# Patient Record
Sex: Male | Born: 1960
Health system: Southern US, Community
[De-identification: ages and names within clinical notes are randomized; demographics above are authoritative.]

## PROBLEM LIST (undated history)

## (undated) DIAGNOSIS — R011 Cardiac murmur, unspecified: Secondary | ICD-10-CM

## (undated) DIAGNOSIS — K589 Irritable bowel syndrome without diarrhea: Secondary | ICD-10-CM

## (undated) DIAGNOSIS — E785 Hyperlipidemia, unspecified: Secondary | ICD-10-CM

## (undated) DIAGNOSIS — R001 Bradycardia, unspecified: Secondary | ICD-10-CM

## (undated) DIAGNOSIS — I10 Essential (primary) hypertension: Secondary | ICD-10-CM

## (undated) DIAGNOSIS — M1710 Unilateral primary osteoarthritis, unspecified knee: Secondary | ICD-10-CM

## (undated) HISTORY — DX: Unilateral primary osteoarthritis, unspecified knee: M17.10

## (undated) HISTORY — DX: Hyperlipidemia, unspecified: E78.5

## (undated) HISTORY — DX: Irritable bowel syndrome, unspecified: K58.9

## (undated) HISTORY — DX: Cardiac murmur, unspecified: R01.1

## (undated) HISTORY — DX: Essential (primary) hypertension: I10

## (undated) HISTORY — DX: Bradycardia, unspecified: R00.1

## (undated) HISTORY — PX: MEDIAL COLLATERAL LIGAMENT REPAIR, KNEE: SHX2019

## (undated) HISTORY — PX: APPENDECTOMY: SHX54

---

## 1999-09-21 ENCOUNTER — Encounter: Payer: Self-pay | Admitting: Orthopedic Surgery

## 1999-09-21 ENCOUNTER — Ambulatory Visit (HOSPITAL_COMMUNITY): Admission: RE | Admit: 1999-09-21 | Discharge: 1999-09-21 | Payer: Self-pay | Admitting: Orthopedic Surgery

## 2005-02-08 ENCOUNTER — Ambulatory Visit: Payer: Self-pay | Admitting: Family Medicine

## 2005-03-17 ENCOUNTER — Ambulatory Visit: Payer: Self-pay | Admitting: Family Medicine

## 2005-03-23 ENCOUNTER — Ambulatory Visit: Payer: Self-pay | Admitting: Family Medicine

## 2005-03-31 ENCOUNTER — Ambulatory Visit: Payer: Self-pay

## 2005-03-31 ENCOUNTER — Encounter: Payer: Self-pay | Admitting: Cardiology

## 2005-09-27 ENCOUNTER — Ambulatory Visit: Payer: Self-pay | Admitting: Family Medicine

## 2006-02-21 ENCOUNTER — Ambulatory Visit: Payer: Self-pay | Admitting: Family Medicine

## 2006-02-23 ENCOUNTER — Ambulatory Visit: Payer: Self-pay | Admitting: Family Medicine

## 2006-04-14 ENCOUNTER — Ambulatory Visit: Payer: Self-pay | Admitting: Family Medicine

## 2006-05-26 ENCOUNTER — Ambulatory Visit: Payer: Self-pay | Admitting: Family Medicine

## 2006-07-05 ENCOUNTER — Ambulatory Visit: Payer: Self-pay | Admitting: Family Medicine

## 2006-07-05 DIAGNOSIS — K219 Gastro-esophageal reflux disease without esophagitis: Secondary | ICD-10-CM | POA: Insufficient documentation

## 2006-08-18 DIAGNOSIS — I10 Essential (primary) hypertension: Secondary | ICD-10-CM | POA: Insufficient documentation

## 2006-08-25 ENCOUNTER — Ambulatory Visit: Payer: Self-pay | Admitting: Family Medicine

## 2006-10-24 ENCOUNTER — Ambulatory Visit: Payer: Self-pay | Admitting: Family Medicine

## 2006-10-24 LAB — CONVERTED CEMR LAB
BUN: 14 mg/dL (ref 6–23)
CO2: 32 meq/L (ref 19–32)
Calcium: 9.1 mg/dL (ref 8.4–10.5)
Chloride: 109 meq/L (ref 96–112)
Creatinine, Ser: 1 mg/dL (ref 0.4–1.5)
GFR calc Af Amer: 103 mL/min
GFR calc non Af Amer: 85 mL/min
Glucose, Bld: 97 mg/dL (ref 70–99)
Potassium: 4.5 meq/L (ref 3.5–5.1)
Sodium: 147 meq/L — ABNORMAL HIGH (ref 135–145)

## 2006-10-25 ENCOUNTER — Encounter (INDEPENDENT_AMBULATORY_CARE_PROVIDER_SITE_OTHER): Payer: Self-pay | Admitting: *Deleted

## 2006-10-27 ENCOUNTER — Ambulatory Visit: Payer: Self-pay | Admitting: Family Medicine

## 2007-02-26 ENCOUNTER — Ambulatory Visit: Payer: Self-pay | Admitting: Family Medicine

## 2007-02-26 LAB — CONVERTED CEMR LAB
ALT: 19 units/L (ref 0–53)
AST: 18 units/L (ref 0–37)
Albumin: 3.8 g/dL (ref 3.5–5.2)
Alkaline Phosphatase: 63 units/L (ref 39–117)
BUN: 15 mg/dL (ref 6–23)
Bilirubin, Direct: 0.1 mg/dL (ref 0.0–0.3)
CO2: 31 meq/L (ref 19–32)
Calcium: 9.4 mg/dL (ref 8.4–10.5)
Chloride: 103 meq/L (ref 96–112)
Cholesterol: 202 mg/dL (ref 0–200)
Creatinine, Ser: 1 mg/dL (ref 0.4–1.5)
Direct LDL: 133.3 mg/dL
GFR calc Af Amer: 103 mL/min
GFR calc non Af Amer: 85 mL/min
Glucose, Bld: 98 mg/dL (ref 70–99)
HDL: 52.8 mg/dL (ref >39.0)
Potassium: 4.4 meq/L (ref 3.5–5.1)
Sodium: 140 meq/L (ref 135–145)
TSH: 0.65 microintl units/mL (ref 0.35–5.50)
Total Bilirubin: 0.7 mg/dL (ref 0.3–1.2)
Total CHOL/HDL Ratio: 3.8
Total Protein: 6.7 g/dL (ref 6.0–8.3)
Triglycerides: 133 mg/dL (ref 0–149)
VLDL: 27 mg/dL (ref 0–40)

## 2007-03-01 ENCOUNTER — Ambulatory Visit: Payer: Self-pay | Admitting: Family Medicine

## 2007-03-01 DIAGNOSIS — K589 Irritable bowel syndrome without diarrhea: Secondary | ICD-10-CM | POA: Insufficient documentation

## 2008-03-03 ENCOUNTER — Ambulatory Visit: Payer: Self-pay | Admitting: Family Medicine

## 2008-03-03 LAB — CONVERTED CEMR LAB
ALT: 19 units/L (ref 0–53)
AST: 15 units/L (ref 0–37)
Albumin: 3.7 g/dL (ref 3.5–5.2)
Alkaline Phosphatase: 54 units/L (ref 39–117)
BUN: 13 mg/dL (ref 6–23)
Bilirubin, Direct: 0.1 mg/dL (ref 0.0–0.3)
CO2: 31 meq/L (ref 19–32)
Calcium: 8.7 mg/dL (ref 8.4–10.5)
Chloride: 105 meq/L (ref 96–112)
Cholesterol: 226 mg/dL (ref 0–200)
Creatinine, Ser: 1.1 mg/dL (ref 0.4–1.5)
Direct LDL: 129.9 mg/dL
GFR calc Af Amer: 92 mL/min
GFR calc non Af Amer: 76 mL/min
Glucose, Bld: 91 mg/dL (ref 70–99)
HDL: 55 mg/dL (ref >39.0)
Potassium: 4.1 meq/L (ref 3.5–5.1)
Sodium: 142 meq/L (ref 135–145)
TSH: 0.61 microintl units/mL (ref 0.35–5.50)
Total Bilirubin: 0.6 mg/dL (ref 0.3–1.2)
Total CHOL/HDL Ratio: 4.1
Total Protein: 6.2 g/dL (ref 6.0–8.3)
Triglycerides: 126 mg/dL (ref 0–149)
VLDL: 25 mg/dL (ref 0–40)

## 2008-03-11 ENCOUNTER — Ambulatory Visit: Payer: Self-pay | Admitting: Family Medicine

## 2008-07-03 ENCOUNTER — Ambulatory Visit: Payer: Self-pay | Admitting: Family Medicine

## 2009-03-09 ENCOUNTER — Ambulatory Visit: Payer: Self-pay | Admitting: Family Medicine

## 2009-03-09 LAB — CONVERTED CEMR LAB
ALT: 22 units/L (ref 0–53)
AST: 18 units/L (ref 0–37)
Albumin: 3.9 g/dL (ref 3.5–5.2)
Alkaline Phosphatase: 58 units/L (ref 39–117)
BUN: 15 mg/dL (ref 6–23)
Basophils Absolute: 0 10*3/uL (ref 0.0–0.1)
Basophils Relative: 1 % (ref 0.0–3.0)
Bilirubin, Direct: 0 mg/dL (ref 0.0–0.3)
CO2: 29 meq/L (ref 19–32)
Calcium: 8.7 mg/dL (ref 8.4–10.5)
Chloride: 107 meq/L (ref 96–112)
Cholesterol: 207 mg/dL — ABNORMAL HIGH (ref 0–200)
Creatinine, Ser: 0.9 mg/dL (ref 0.4–1.5)
Direct LDL: 135.2 mg/dL
Eosinophils Absolute: 0.1 10*3/uL (ref 0.0–0.7)
Eosinophils Relative: 1.9 % (ref 0.0–5.0)
GFR calc non Af Amer: 94.88 mL/min (ref >60)
Glucose, Bld: 98 mg/dL (ref 70–99)
HCT: 46.3 % (ref 39.0–52.0)
HDL: 54.9 mg/dL (ref >39.00)
Hemoglobin: 15.4 g/dL (ref 13.0–17.0)
Lymphocytes Relative: 24.2 % (ref 12.0–46.0)
Lymphs Abs: 1 10*3/uL (ref 0.7–4.0)
MCHC: 33.3 g/dL (ref 30.0–36.0)
MCV: 95.2 fL (ref 78.0–100.0)
Monocytes Absolute: 0.3 10*3/uL (ref 0.1–1.0)
Monocytes Relative: 7.2 % (ref 3.0–12.0)
Neutro Abs: 2.9 10*3/uL (ref 1.4–7.7)
Neutrophils Relative %: 65.7 % (ref 43.0–77.0)
PSA: 1.22 ng/mL (ref 0.10–4.00)
Platelets: 169 10*3/uL (ref 150.0–400.0)
Potassium: 4.3 meq/L (ref 3.5–5.1)
RBC: 4.86 M/uL (ref 4.22–5.81)
RDW: 12.2 % (ref 11.5–14.6)
Sodium: 143 meq/L (ref 135–145)
TSH: 0.42 microintl units/mL (ref 0.35–5.50)
Total Bilirubin: 0.7 mg/dL (ref 0.3–1.2)
Total CHOL/HDL Ratio: 4
Total Protein: 6.4 g/dL (ref 6.0–8.3)
Triglycerides: 129 mg/dL (ref 0.0–149.0)
VLDL: 25.8 mg/dL (ref 0.0–40.0)
WBC: 4.3 10*3/uL — ABNORMAL LOW (ref 4.5–10.5)

## 2009-03-12 ENCOUNTER — Ambulatory Visit: Payer: Self-pay | Admitting: Family Medicine

## 2009-06-18 ENCOUNTER — Encounter (INDEPENDENT_AMBULATORY_CARE_PROVIDER_SITE_OTHER): Payer: Self-pay | Admitting: *Deleted

## 2009-06-19 ENCOUNTER — Ambulatory Visit: Payer: Self-pay | Admitting: Internal Medicine

## 2009-07-02 ENCOUNTER — Ambulatory Visit: Payer: Self-pay | Admitting: Internal Medicine

## 2009-07-02 LAB — HM COLONOSCOPY

## 2009-07-05 ENCOUNTER — Encounter: Payer: Self-pay | Admitting: Internal Medicine

## 2009-11-17 ENCOUNTER — Encounter (INDEPENDENT_AMBULATORY_CARE_PROVIDER_SITE_OTHER): Payer: Self-pay | Admitting: *Deleted

## 2010-03-15 ENCOUNTER — Ambulatory Visit: Payer: Self-pay | Admitting: Family Medicine

## 2010-03-16 LAB — CONVERTED CEMR LAB
ALT: 21 units/L (ref 0–53)
AST: 16 units/L (ref 0–37)
Albumin: 4 g/dL (ref 3.5–5.2)
Alkaline Phosphatase: 68 units/L (ref 39–117)
BUN: 18 mg/dL (ref 6–23)
Bilirubin, Direct: 0.1 mg/dL (ref 0.0–0.3)
CO2: 30 meq/L (ref 19–32)
Calcium: 8.9 mg/dL (ref 8.4–10.5)
Chloride: 102 meq/L (ref 96–112)
Cholesterol: 243 mg/dL — ABNORMAL HIGH (ref 0–200)
Creatinine, Ser: 1 mg/dL (ref 0.4–1.5)
Direct LDL: 157.8 mg/dL
GFR calc non Af Amer: 81.79 mL/min (ref >60.00)
Glucose, Bld: 100 mg/dL — ABNORMAL HIGH (ref 70–99)
HDL: 60.1 mg/dL (ref >39.00)
PSA: 1.18 ng/mL (ref 0.10–4.00)
Potassium: 4.3 meq/L (ref 3.5–5.1)
Sodium: 140 meq/L (ref 135–145)
TSH: 0.65 microintl units/mL (ref 0.35–5.50)
Total Bilirubin: 0.7 mg/dL (ref 0.3–1.2)
Total CHOL/HDL Ratio: 4
Total Protein: 6.5 g/dL (ref 6.0–8.3)
Triglycerides: 171 mg/dL — ABNORMAL HIGH (ref 0.0–149.0)
VLDL: 34.2 mg/dL (ref 0.0–40.0)

## 2010-03-18 ENCOUNTER — Ambulatory Visit: Payer: Self-pay | Admitting: Family Medicine

## 2010-05-11 NOTE — Procedures (Signed)
Summary: Colonoscopy  Patient: [name] Note: All result statuses are Final unless otherwise noted.  Tests: (1) Colonoscopy (COL)   COL Colonoscopy           DONE     [facility]     [address]     [place], [place]  [address]           COLONOSCOPY PROCEDURE REPORT           PATIENT:  [name], Donald  MR#:  [id number]     BIRTHDATE:  July 22, 1960, 48 yrs. old  GENDER:  male     ENDOSCOPIST:  Wilhemina Bonito. Eda Keys, MD     REF. BY:  Screening (Recall)     PROCEDURE DATE:  07/02/2009     PROCEDURE:  Colonoscopy with snare polypectomy     ASA CLASS:  Class II     INDICATIONS:  Routine Risk Screening     MEDICATIONS:   Fentanyl 50 mcg IV, Versed 7 mg IV           DESCRIPTION OF PROCEDURE:   After the risks benefits and     alternatives of the procedure were thoroughly explained, informed     consent was obtained.  Digital rectal exam was performed and     revealed no abnormalities.   The LB CF-[id number] endoscope     was introduced through the anus and advanced to the cecum, which     was identified by both the appendix and ileocecal valve, without     limitations.TIME TO CECUM = 2:18 MIN. The quality of the prep was     excellent, using MoviPrep.  The instrument was then slowly     withdrawn (TIME = 12:08 MIN) as the colon was fully examined.     <<PROCEDUREIMAGES>>           FINDINGS:  A 3mm diminutive polyp was found at the hepatic     flexure. Polyp was snared without cautery. Retrieval was     successful.  This was otherwise a normal examination of the colon.     Retroflexed views in the rectum revealed internal hemorrhoids.     The scope was then withdrawn from the patient and the procedure     completed.           COMPLICATIONS:  None     ENDOSCOPIC IMPRESSION:     1) Diminutive polyp at the hepatic flexure - removed     2) Otherwise normal examination     3) Internal hemorrhoids           RECOMMENDATIONS:     1) Repeat colonoscopy in 5 years if polyp  adenomatous; otherwise     10 years           ______________________________     Wilhemina Bonito. Eda Keys, MD           CC:  Arta Silence, MD; The Patient           n.     eSIGNED:   Wilhemina Bonito. Eda Keys at 07/02/2009 09:18 AM           Sharyon Medicus, [id number]  Note: An exclamation mark (!) indicates a result that was not dispersed into the flowsheet. Document Creation Date: 07/02/2009 9:19 AM _______________________________________________________________________  (1) Order result status: Final Collection or observation date-time: 07/02/2009 09:10 Requested date-time:  Receipt date-time:  Reported date-time:  Referring Physician:   Ordering Physician: Yancey Flemings  Montez Hageman [phone]) Specimen Source:  Source: Launa Grill Order Number: [id number] Lab site:   Appended Document: Colonoscopy recall in 5 yrs     Procedures Next Due Date:    Colonoscopy: 06/2014

## 2010-05-11 NOTE — Letter (Signed)
Summary: Nadara Eaton letter  [place] [facility]  [address] [place], [place] [address]   Phone: [phone]  Fax: [phone]       11/17/2009 MRN: [id number]  Donald [name] [address] [place], [place]  [address]  Dear Mr. HEINZ,  [facility] [place], and [facility] announce the retirement of Arta Silence, M.D., from full-time practice at the [facility] office effective October 08, 2009 and his plans of returning part-time.  It is important to Dr. Hetty Ely and to our practice that you understand that [facility] has seven physicians in our office for your health care needs.  We will continue to offer the same exceptional care that you have today.    Dr. Hetty Ely has spoken to many of you about his plans for retirement and returning part-time in the fall.   We will continue to work with you through the transition to schedule appointments for you in the office and meet the high standards that [place] is committed to.   Again, it is with great pleasure that we share the news that Dr. Hetty Ely will return to [facility] in October of 2011 with a reduced schedule.    If you have any questions, or would like to request an appointment with one of our physicians, please call us at [phone] and press the option for Scheduling an appointment.  We take pleasure in providing you with excellent patient care and look forward to seeing you at your next office visit.  Our [facility] Physicians are:  Tillman Abide, M.D. Laurita Quint, M.D. Roxy Manns, M.D. Kerby Nora, M.D. Hannah Beat, M.D. Ruthe Mannan, M.D. We proudly welcomed Raechel Ache, M.D. and Eustaquio Boyden, M.D. to the practice in July/August 2011.  Sincerely,  [facility]

## 2010-05-11 NOTE — Letter (Signed)
Summary: [facility] Instructions  [facility]  [address] [place], [place] [address]   Phone: [phone]  Fax: [phone]       JERRID FORGETTE    1960/06/18    MRN: [id number]        Procedure Day /Date:  07/02/09   Thursday     Arrival Time:  8:00am      Procedure Time: 9:00am     Location of Procedure:                    _ x_  [facility] (4th Floor)   PREPARATION FOR COLONOSCOPY WITH MOVIPREP   Starting 5 days prior to your procedure _ 3/19/11_ do not eat nuts, seeds, popcorn, corn, beans, peas,  salads, or any raw vegetables.  Do not take any fiber supplements (e.g. Metamucil, Citrucel, and Benefiber).  THE DAY BEFORE YOUR PROCEDURE         DATE:   07/01/09  DAY:  Wednesday  1.  Drink clear liquids the entire day-NO SOLID FOOD  2.  Do not drink anything colored red or purple.  Avoid juices with pulp.  No orange juice.  3.  Drink at least 64 oz. (8 glasses) of fluid/clear liquids during the day to prevent dehydration and help the prep work efficiently.  CLEAR LIQUIDS INCLUDE: Water Jello Ice Popsicles Tea (sugar ok, no milk/cream) Powdered fruit flavored drinks Coffee (sugar ok, no milk/cream) Gatorade Juice: apple, white grape, white cranberry  Lemonade Clear bullion, consomm, broth Carbonated beverages (any kind) Strained chicken noodle soup Hard Candy                             4.  In the morning, mix first dose of MoviPrep solution:    Empty 1 Pouch A and 1 Pouch B into the disposable container    Add lukewarm drinking water to the top line of the container. Mix to dissolve    Refrigerate (mixed solution should be used within 24 hrs)  5.  Begin drinking the prep at 5:00 p.m. The MoviPrep container is divided by 4 marks.   Every 15 minutes drink the solution down to the next mark (approximately 8 oz) until the full liter is complete.   6.  Follow completed prep with 16 oz of clear liquid of your choice (Nothing red or  purple).  Continue to drink clear liquids until bedtime.  7.  Before going to bed, mix second dose of MoviPrep solution:    Empty 1 Pouch A and 1 Pouch B into the disposable container    Add lukewarm drinking water to the top line of the container. Mix to dissolve    Refrigerate  THE DAY OF YOUR PROCEDURE      DATE:   07/02/09 DAY:   Thursday  Beginning at  4:00am  (5 hours before procedure):         1. Every 15 minutes, drink the solution down to the next mark (approx 8 oz) until the full liter is complete.  2. Follow completed prep with 16 oz. of clear liquid of your choice.    3. You may drink clear liquids until 7:00am   (2 HOURS BEFORE PROCEDURE).   MEDICATION INSTRUCTIONS  Unless otherwise instructed, you should take regular prescription medications with a small sip of water   as early as possible the morning of your procedure.    Additional medication instructions:  Do not take HCTZ morning of procedure.         OTHER INSTRUCTIONS  You will need a responsible adult at least 52 years of age to accompany you and drive you home.   This person must remain in the waiting room during your procedure.  Wear loose fitting clothing that is easily removed.  Leave jewelry and other valuables at home.  However, you may wish to bring a book to read or  an iPod/MP3 player to listen to music as you wait for your procedure to start.  Remove all body piercing jewelry and leave at home.  Total time from sign-in until discharge is approximately 2-3 hours.  You should go home directly after your procedure and rest.  You can resume normal activities the  day after your procedure.  The day of your procedure you should not:   Drive   Make legal decisions   Operate machinery   Drink alcohol   Return to work  You will receive specific instructions about eating, activities and medications before you leave.    The above instructions have been reviewed and explained to me  by   Ezra Sites RN  June 19, 2009 8:12 AM     I fully understand and can verbalize these instructions _____________________________ Date _________

## 2010-05-11 NOTE — Letter (Signed)
Summary: Patient Notice- Polyp Results  [facility]  [address] [place], [place] [address]   Phone: [phone]  Fax: [phone]        July 05, 2009 MRN: [id number]    Donald [name] [address] [place], [place]  [address]    Dear [name],  I am pleased to inform you that the colon polyp(s) removed during your recent colonoscopy was (were) found to be benign (no cancer detected) upon pathologic examination.  I recommend you have a repeat colonoscopy examination in 5 years to look for recurrent polyps, as having colon polyps increases your risk for having recurrent polyps or even colon cancer in the future.  Should you develop new or worsening symptoms of abdominal pain, bowel habit changes or bleeding from the rectum or bowels, please schedule an evaluation with either your primary care physician or with me.  Additional information/recommendations:  __ No further action with gastroenterology is needed at this time. Please      follow-up with your primary care physician for your other healthcare      needs.   Please call us if you are having persistent problems or have questions about your condition that have not been fully answered at this time.  Sincerely,  Hilarie Fredrickson MD  This letter has been electronically signed by your physician.  Appended Document: Patient Notice- Polyp Results letter mailed 3.30.11

## 2010-05-11 NOTE — Assessment & Plan Note (Signed)
Summary: CPX/CLE  R/S FROM 03/17/10   Vital Signs:  Patient profile:   50 year old male Weight:      175.50 pounds BMI:     30.71 Temp:     98.2 degrees F oral Pulse rate:   60 / minute Pulse rhythm:   irregular BP sitting:   128 / 80  (left arm) Cuff size:   large  Vitals Entered By: Sydell Axon LPN (March 18, 2010 2:21 PM) CC: 30 Minute checkup, had a colonoscopy 03/11 by Dr. Marina Goodell   History of Present Illness: Pt here for Comp Exam, feels well with no complaints. He had a colonoscopy earlier this year with one polyp....repeat in 5 yrs.  Preventive Screening-Counseling & Management  Alcohol-Tobacco     Alcohol drinks/day: 0     Smoking Status: never     Passive Smoke Exposure: no  Caffeine-Diet-Exercise     Caffeine use/day: 2     Does Patient Exercise: yes     Type of exercise: swims     Exercise (avg: min/session): 30-60     Times/week: <3  Problems Prior to Update: 1)  Special Screening Malignant Neoplasm of Prostate  (ICD-V76.44) 2)  Health Maintenance Exam  (ICD-V70.0) 3)  Ibs  (ICD-564.1) 4)  Gerd  (ICD-530.81) 5)  Hypercholesterolemia 252/trig 214/ Ldl 141  (ICD-272.0) 6)  Hypertension  (ICD-401.9)  Medications Prior to Update: 1)  Fp Odorless Garlic 1000 Mg Caps (Garlic) .... Take One By Mouth Daily 2)  Aspirin 81 Mg Tabs (Aspirin) .... Take One By Mouth Daily 3)  Multivitamin With Lycopene .... Take One By Mouth Daily 4)  Vitamin C 500 Mg Tabs (Ascorbic Acid) .... Take One By Mouth Daily 5)  Lisinopril 20 Mg Tabs (Lisinopril) .... Take One By Mouth in Evening 6)  B Complex Vitamins Plus  Tabs (Multiple Vitamins-Minerals) .... Take One By Mouth Daily 7)  Magnesium 250 Mg Tabs (Magnesium) .... Take One By Mouth Daily 8)  Hydrochlorothiazide 12.5 Mg Caps (Hydrochlorothiazide) .... One Tab By Mouth in The Am  Current Medications (verified): 1)  Fp Odorless Garlic 1000 Mg Caps (Garlic) .... Take One By Mouth Daily 2)  Aspirin 81 Mg Tabs (Aspirin) ....  Take One By Mouth Daily 3)  Multivitamin With Lycopene .... Take One By Mouth Daily 4)  Vitamin C 500 Mg Tabs (Ascorbic Acid) .... Take One By Mouth Daily 5)  Lisinopril 20 Mg Tabs (Lisinopril) .... Take One By Mouth in Evening 6)  B Complex Vitamins Plus  Tabs (Multiple Vitamins-Minerals) .... Take One By Mouth Daily 7)  Magnesium 250 Mg Tabs (Magnesium) .... Take One By Mouth Daily 8)  Hydrochlorothiazide 12.5 Mg Caps (Hydrochlorothiazide) .... One Tab By Mouth in The Am  Allergies: No Known Drug Allergies  Past History:  Past Medical History: Last updated: 08/18/2006 Hypertension  Past Surgical History: Last updated: 07/09/2009 Appy  10 yoa Colonoscopy Nml (Dr Marina Goodell) 2000 ECHO EF 55-65%, wnl except L. atrium mildly dilated 03/31/2005 Colonoscopy Diminutive Polyp at Hepatic Flexure B9  Int Hemms (Dr Marina Goodell) 07/02/2009      5 years  Family History: Last updated: 03/18/2010 Father: A 81  MI x 2 (58/60) PTCA, HTN Mother: dec 68, kidney disease, secondary HTN Brother A 60 HTN, lung disease, past smoker  Brother A 58  HTN,  Sister A 48 Obese Prostate cancer PGF CV, father, MGM died MI,  MGF died MI  Social History: Last updated: 08/18/2006 Marital Status: Married Children: 1 Occupation: Animator  Programer  Risk Factors: Alcohol Use: 0 (03/18/2010) Caffeine Use: 2 (03/18/2010) Exercise: yes (03/18/2010)  Risk Factors: Smoking Status: never (03/18/2010) Passive Smoke Exposure: no (03/18/2010)  Family History: Father: A 81  MI x 2 (58/60) PTCA, HTN Mother: dec 68, kidney disease, secondary HTN Brother A 60 HTN, lung disease, past smoker  Brother A 58  HTN,  Sister A 48 Obese Prostate cancer PGF CV, father, MGM died MI,  MGF died MI  Review of Systems General:  Denies chills, fatigue, fever, sweats, weakness, and weight loss. Eyes:  Denies blurring, discharge, and eye pain. ENT:  Denies decreased hearing, ear discharge, earache, and ringing in ears. CV:  Denies  chest pain or discomfort, fainting, fatigue, palpitations, shortness of breath with exertion, swelling of feet, and swelling of hands. Resp:  Denies cough, shortness of breath, and wheezing. GI:  Denies abdominal pain, bloody stools, change in bowel habits, constipation, dark tarry stools, diarrhea, indigestion, loss of appetite, nausea, vomiting, vomiting blood, and yellowish skin color. GU:  Complains of nocturia; denies decreased libido, discharge, dysuria, and urinary frequency; once. MS:  Denies joint pain, low back pain, muscle aches, cramps, and stiffness. Derm:  Denies dryness, itching, and rash. Neuro:  Denies numbness, poor balance, tingling, and tremors.  Physical Exam  General:  Well-developed,well-nourished,in no acute distress; alert,appropriate and cooperative throughout examination, minimally obese. Head:  Normocephalic and atraumatic without obvious abnormalities. No apparent alopecia or balding. Sinuses NT. Eyes:  No corneal or conjunctival inflammation noted. EOMI. Perrla. Funduscopic exam benign, without hemorrhages, exudates or papilledema. Vision grossly normal. Ears:  External ear exam shows no significant lesions or deformities.  Otoscopic examination reveals clear canals, tympanic membranes are intact bilaterally without bulging, retraction, inflammation or discharge. Hearing is grossly normal bilaterally. Nose:  External nasal examination shows no deformity or inflammation. Nasal mucosa are pink and moist without lesions or exudates. Mouth:  Oral mucosa and oropharynx without lesions or exudates.  Teeth in good repair. Neck:  No deformities, masses, or tenderness noted. Chest Wall:  No deformities, masses, tenderness or gynecomastia noted. Breasts:  No masses or gynecomastia noted Lungs:  Normal respiratory effort, chest expands symmetrically. Lungs are clear to auscultation, no crackles or wheezes. Heart:  Normal rate and regular rhythm. S1 and S2 normal without  gallop, murmur, click, rub or other extra sounds. Abdomen:  Bowel sounds positive,abdomen soft and non-tender without masses, organomegaly or hernias noted. Rectal:  No external abnormalities noted. Normal sphincter tone. No rectal masses or tenderness. G neg. Genitalia:  Testes bilaterally descended without nodularity, tenderness or masses. No scrotal masses or lesions. No penis lesions or urethral discharge. Prostate:  Prostate gland firm and smooth, no enlargement, nodularity, tenderness, mass, asymmetry or induration. 20gms. Msk:  No deformity or scoliosis noted of thoracic or lumbar spine.   Pulses:  R and L carotid,radial,femoral,dorsalis pedis and posterior tibial pulses are full and equal bilaterally Extremities:  No clubbing, cyanosis, edema, or deformity noted with normal full range of motion of all joints.   Neurologic:  No cranial nerve deficits noted. Station and gait are normal. Sensory, motor and coordinative functions appear intact. Skin:  Intact without suspicious lesions or rashes, some benign moles. Cervical Nodes:  No lymphadenopathy noted Inguinal Nodes:  No significant adenopathy Psych:  Cognition and judgment appear intact. Alert and cooperative with normal attention span and concentration. No apparent delusions, illusions, hallucinations   Impression & Recommendations:  Problem # 1:  HEALTH MAINTENANCE EXAM (ICD-V70.0)  Reviewed preventive care protocols, scheduled  due services, and updated immunizations.  Problem # 2:  SPECIAL SCREENING MALIGNANT NEOPLASM OF PROSTATE (ICD-V76.44) Assessment: Unchanged Stable PSA and exam altho firm.  Problem # 3:  IBS (ICD-564.1) Assessment: Unchanged Stable.  Problem # 4:  GERD (ICD-530.81) Assessment: Unchanged Stable without sxs.  Problem # 5:  HYPERCHOLESTEROLEMIA 252/TRIG 214/ LDL 141 (ICD-272.0) LDL slightly high....has been stable at 130 in the past untilk this year. He will try hard with diet and exercise. Labs  Reviewed: SGOT: 16 (03/15/2010)   SGPT: 21 (03/15/2010)   HDL:60.10 (03/15/2010), 54.90 (03/09/2009)  LDL:DEL (03/03/2008), DEL (02/26/2007)  Chol:243 (03/15/2010), 207 (03/09/2009)  Trig:171.0 (03/15/2010), 129.0 (03/09/2009)  Problem # 6:  HYPERTENSION (ICD-401.9) Assessment: Improved Stable. His updated medication list for this problem includes:    Lisinopril 20 Mg Tabs (Lisinopril) .Marland Kitchen... Take one by mouth in evening    Hydrochlorothiazide 12.5 Mg Caps (Hydrochlorothiazide) ..... One tab by mouth in the am  BP today: 128/80 Prior BP: 140/80 (03/12/2009)  Labs Reviewed: K+: 4.3 (03/15/2010) Creat: : 1.0 (03/15/2010)   Chol: 243 (03/15/2010)   HDL: 60.10 (03/15/2010)   LDL: DEL (03/03/2008)   TG: 171.0 (03/15/2010)  Complete Medication List: 1)  Fp Odorless Garlic 1000 Mg Caps (Garlic) .... Take one by mouth daily 2)  Aspirin 81 Mg Tabs (Aspirin) .... Take one by mouth daily 3)  Multivitamin With Lycopene  .... Take one by mouth daily 4)  Vitamin C 500 Mg Tabs (Ascorbic acid) .... Take one by mouth daily 5)  Lisinopril 20 Mg Tabs (Lisinopril) .... Take one by mouth in evening 6)  B Complex Vitamins Plus Tabs (Multiple vitamins-minerals) .... Take one by mouth daily 7)  Magnesium 250 Mg Tabs (Magnesium) .... Take one by mouth daily 8)  Hydrochlorothiazide 12.5 Mg Caps (Hydrochlorothiazide) .... One tab by mouth in the am Prescriptions: HYDROCHLOROTHIAZIDE 12.5 MG CAPS (HYDROCHLOROTHIAZIDE) one tab by mouth in the AM  #30 Each x 0   Entered and Authorized by:   Shaune Leeks MD   Signed by:   Shaune Leeks MD on 03/18/2010   Method used:   Electronically to        [facility] [phone]* (retail)       [address]       [place], [place]  [address]       Ph: [phone]       Fax: [phone]   RxID:   [id number] LISINOPRIL 20 MG TABS (LISINOPRIL) Take one by mouth in evening  #30 Each x 12   Entered and Authorized by:   Shaune Leeks MD   Signed  by:   Shaune Leeks MD on 03/18/2010   Method used:   Electronically to        [facility] [phone]* (retail)       [address]       [place], [place]  [address]       Ph: [phone]       Fax: [phone]   RxID:   [id number]    Orders Added: 1)  Est. Patient 40-64 years [[address]]    Current Allergies (reviewed today): No known allergies

## 2010-05-11 NOTE — Miscellaneous (Signed)
Summary: [facility]  Clinical Lists Changes  Medications: Added new medication of MOVIPREP 100 GM  SOLR (PEG-KCL-NACL-NASULF-NA ASC-C) As per prep instructions. - Signed Rx of MOVIPREP 100 GM  SOLR (PEG-KCL-NACL-NASULF-NA ASC-C) As per prep instructions.;  #1 x 0;  Signed;  Entered by: Ezra Sites RN;  Authorized by: Hilarie Fredrickson MD;  Method used: Electronically to [facility] [phone]*, [address] [place], [place]  [address], Ph: [phone], Fax: [phone] Observations: Added new observation of NKA: T (06/19/2009 7:55)    Prescriptions: MOVIPREP 100 GM  SOLR (PEG-KCL-NACL-NASULF-NA ASC-C) As per prep instructions.  #1 x 0   Entered by:   Ezra Sites RN   Authorized by:   Hilarie Fredrickson MD   Signed by:   Ezra Sites RN on 06/19/2009   Method used:   Electronically to        [facility] [phone]* (retail)       [address]       [place], [place]  [address]       Ph: [phone]       Fax: [phone]   RxID:   [id number]

## 2010-11-10 ENCOUNTER — Ambulatory Visit (INDEPENDENT_AMBULATORY_CARE_PROVIDER_SITE_OTHER): Payer: BC Managed Care – PPO | Admitting: Family Medicine

## 2010-11-10 ENCOUNTER — Encounter: Payer: Self-pay | Admitting: Family Medicine

## 2010-11-10 DIAGNOSIS — J069 Acute upper respiratory infection, unspecified: Secondary | ICD-10-CM

## 2010-11-10 DIAGNOSIS — I10 Essential (primary) hypertension: Secondary | ICD-10-CM

## 2010-11-10 NOTE — Patient Instructions (Signed)
Take Guaifenesin (400mg ), take 11/2 tabs by mouth AM and NOON. Get GUAIFENESIN by  [facility] or [facility] and getting MUCOUS RELIEF EXPECTORANT/CONGESTION. DO NOT GET MUCINEX (Timed Release Guaifenesin)  Drink lots of fluids. Tylenol 1-2 tabs po q4h prn Gargle with 30ccs of warm salt water every half hour for 2 days as able. Keep lozenge in mouth as needed prior to bed.

## 2010-11-10 NOTE — Assessment & Plan Note (Signed)
Good control. No medications needed at this point.

## 2010-11-10 NOTE — Assessment & Plan Note (Signed)
See instructions

## 2010-11-10 NOTE — Progress Notes (Signed)
  Subjective:    Patient ID: Donald [name], male    DOB: [birth year], 50 y.o.   MRN: [id number]  HPI Pt here as acute appt for URI sxs. He has cold sxs, he has had fever to 99.5 this AM. He denies headache but has pressure at different times. No ear pain, rhinitis that has been clear but this AM was yellow as was the cough production which cleared as the day has progressed. Mild ST in the AM. He has cough, productive at times. He has taken     Review of SystemsNoncontributory except as above.       Objective:   Physical Exam  Constitutional: He appears well-developed and well-nourished. No distress.  HENT:  Head: Normocephalic and atraumatic.  Right Ear: External ear normal.  Left Ear: External ear normal.  Nose: Nose normal.  Mouth/Throat: Oropharynx is clear and moist.  Eyes: Conjunctivae and EOM are normal. Pupils are equal, round, and reactive to light. Right eye exhibits no discharge. Left eye exhibits no discharge.  Neck: Normal range of motion. Neck supple.       Pharynx minimally erythematous  Cardiovascular: Normal rate and regular rhythm.   Pulmonary/Chest: Effort normal and breath sounds normal. He has no wheezes.  Lymphadenopathy:    He has no cervical adenopathy.  Skin: He is not diaphoretic.          Assessment & Plan:

## 2011-03-16 ENCOUNTER — Other Ambulatory Visit: Payer: Self-pay | Admitting: Family Medicine

## 2011-03-16 DIAGNOSIS — Z125 Encounter for screening for malignant neoplasm of prostate: Secondary | ICD-10-CM

## 2011-03-16 DIAGNOSIS — K589 Irritable bowel syndrome without diarrhea: Secondary | ICD-10-CM

## 2011-03-16 DIAGNOSIS — Z0000 Encounter for general adult medical examination without abnormal findings: Secondary | ICD-10-CM

## 2011-03-16 DIAGNOSIS — I10 Essential (primary) hypertension: Secondary | ICD-10-CM

## 2011-03-21 ENCOUNTER — Other Ambulatory Visit (INDEPENDENT_AMBULATORY_CARE_PROVIDER_SITE_OTHER): Payer: BC Managed Care – PPO

## 2011-03-21 DIAGNOSIS — Z125 Encounter for screening for malignant neoplasm of prostate: Secondary | ICD-10-CM

## 2011-03-21 DIAGNOSIS — I10 Essential (primary) hypertension: Secondary | ICD-10-CM

## 2011-03-21 DIAGNOSIS — Z0000 Encounter for general adult medical examination without abnormal findings: Secondary | ICD-10-CM

## 2011-03-21 DIAGNOSIS — K589 Irritable bowel syndrome without diarrhea: Secondary | ICD-10-CM

## 2011-03-21 LAB — HEPATIC FUNCTION PANEL
Albumin: 4 g/dL (ref 3.5–5.2)
Total Bilirubin: 0.7 mg/dL (ref 0.3–1.2)

## 2011-03-21 LAB — LIPID PANEL
Cholesterol: 197 mg/dL (ref 0–200)
HDL: 57.1 mg/dL (ref >39.00)
LDL Cholesterol: 113 mg/dL — ABNORMAL HIGH (ref 0–99)
Triglycerides: 135 mg/dL (ref 0.0–149.0)
VLDL: 27 mg/dL (ref 0.0–40.0)

## 2011-03-21 LAB — BASIC METABOLIC PANEL
Calcium: 9 mg/dL (ref 8.4–10.5)
Creatinine, Ser: 1.2 mg/dL (ref 0.4–1.5)
GFR: 70.22 mL/min (ref >60.00)
Sodium: 142 mEq/L (ref 135–145)

## 2011-03-21 LAB — CBC WITH DIFFERENTIAL/PLATELET
Basophils Absolute: 0 10*3/uL (ref 0.0–0.1)
Eosinophils Absolute: 0.1 10*3/uL (ref 0.0–0.7)
HCT: 46 % (ref 39.0–52.0)
Hemoglobin: 15.5 g/dL (ref 13.0–17.0)
Lymphs Abs: 1.4 10*3/uL (ref 0.7–4.0)
MCHC: 33.8 g/dL (ref 30.0–36.0)
Monocytes Absolute: 0.4 10*3/uL (ref 0.1–1.0)
Monocytes Relative: 7.9 % (ref 3.0–12.0)
Neutro Abs: 3.2 10*3/uL (ref 1.4–7.7)
Platelets: 185 10*3/uL (ref 150.0–400.0)
RDW: 13.5 % (ref 11.5–14.6)

## 2011-03-21 LAB — TSH: TSH: 0.77 u[IU]/mL (ref 0.35–5.50)

## 2011-03-22 ENCOUNTER — Other Ambulatory Visit: Payer: Self-pay | Admitting: Family Medicine

## 2011-03-24 ENCOUNTER — Ambulatory Visit (INDEPENDENT_AMBULATORY_CARE_PROVIDER_SITE_OTHER): Payer: BC Managed Care – PPO | Admitting: Family Medicine

## 2011-03-24 ENCOUNTER — Encounter: Payer: Self-pay | Admitting: Family Medicine

## 2011-03-24 DIAGNOSIS — K589 Irritable bowel syndrome without diarrhea: Secondary | ICD-10-CM

## 2011-03-24 DIAGNOSIS — R739 Hyperglycemia, unspecified: Secondary | ICD-10-CM | POA: Insufficient documentation

## 2011-03-24 DIAGNOSIS — I10 Essential (primary) hypertension: Secondary | ICD-10-CM

## 2011-03-24 DIAGNOSIS — E7800 Pure hypercholesterolemia, unspecified: Secondary | ICD-10-CM | POA: Insufficient documentation

## 2011-03-24 DIAGNOSIS — K219 Gastro-esophageal reflux disease without esophagitis: Secondary | ICD-10-CM

## 2011-03-24 MED ORDER — HYDROCHLOROTHIAZIDE 12.5 MG PO CAPS: 12.5000 mg | ORAL_CAPSULE | ORAL | Status: DC

## 2011-03-24 MED ORDER — LISINOPRIL 20 MG PO TABS: 20.0000 mg | ORAL_TABLET | Freq: Every day | ORAL | Status: DC

## 2011-03-24 NOTE — Assessment & Plan Note (Signed)
Good control. Cont weight loss efforts and cont meds. BP Readings from Last 3 Encounters:  03/24/11 128/80  11/10/10 124/70  03/18/10 128/80

## 2011-03-24 NOTE — Assessment & Plan Note (Signed)
Doing well 

## 2011-03-24 NOTE — Assessment & Plan Note (Signed)
Improved with weight loss. Cont efforts.

## 2011-03-24 NOTE — Patient Instructions (Signed)
RTC one year with Dr Para March, sooner as needed.

## 2011-03-24 NOTE — Progress Notes (Signed)
  Subjective:    Patient ID: Donald [name], male    DOB: 07-14-1960, 50 y.o.   MRN: [id number]  HPI Pt here for Comp Exam. He is generally healthy and has no complaints.  He has lost 18 pounds in 18 weeks. He has restricted his prepared foods and eats more fresh fruits.   Review of Systems  Constitutional: Negative for fever, chills, diaphoresis, appetite change, fatigue and unexpected weight change.  HENT: Negative for hearing loss, ear pain, tinnitus and ear discharge.   Eyes: Negative for pain, discharge and visual disturbance.  Respiratory: Negative for cough, shortness of breath and wheezing.   Cardiovascular: Negative for chest pain and palpitations.       No SOB w/ exertion  Gastrointestinal: Negative for nausea, vomiting, abdominal pain, diarrhea, constipation and blood in stool.       No heartburn or swallowing problems.  Genitourinary: Negative for dysuria, frequency and difficulty urinating.       Nocturia once a night.  Musculoskeletal: Negative for myalgias, back pain and arthralgias.  Skin: Negative for rash.       No itching or dryness.  Neurological: Positive for facial asymmetry. Negative for tremors and numbness.       No tingling or balance problems.  Hematological: Negative for adenopathy. Does not bruise/bleed easily.  Psychiatric/Behavioral: Negative for dysphoric mood and agitation.       Objective:   Physical Exam  Constitutional: He is oriented to person, place, and time. He appears well-developed and well-nourished. No distress.  HENT:  Head: Normocephalic and atraumatic.  Right Ear: External ear normal.  Left Ear: External ear normal.  Nose: Nose normal.  Mouth/Throat: Oropharynx is clear and moist.  Eyes: Conjunctivae and EOM are normal. Pupils are equal, round, and reactive to light. Right eye exhibits no discharge. Left eye exhibits no discharge. No scleral icterus.  Neck: Normal range of motion. Neck supple. No thyromegaly present.  Cardiovascular:  Normal rate, regular rhythm, normal heart sounds and intact distal pulses.   No murmur heard. Pulmonary/Chest: Effort normal and breath sounds normal. No respiratory distress. He has no wheezes.  Abdominal: Soft. Bowel sounds are normal. He exhibits no distension and no mass. There is no tenderness. There is no rebound and no guarding.  Genitourinary: Rectum normal, prostate normal and penis normal. Guaiac negative stool.       Prostate 20 gms, smooth symmetric.  Musculoskeletal: Normal range of motion. He exhibits no edema.  Lymphadenopathy:    He has no cervical adenopathy.  Neurological: He is alert and oriented to person, place, and time. Coordination normal.  Skin: Skin is warm and dry. No rash noted. He is not diaphoretic.  Psychiatric: He has a normal mood and affect. His behavior is normal. Judgment and thought content normal.          Assessment & Plan:  HMPE

## 2012-03-14 ENCOUNTER — Other Ambulatory Visit: Payer: Self-pay | Admitting: Family Medicine

## 2012-03-14 DIAGNOSIS — I10 Essential (primary) hypertension: Secondary | ICD-10-CM

## 2012-03-14 DIAGNOSIS — Z125 Encounter for screening for malignant neoplasm of prostate: Secondary | ICD-10-CM

## 2012-03-21 ENCOUNTER — Other Ambulatory Visit (INDEPENDENT_AMBULATORY_CARE_PROVIDER_SITE_OTHER): Payer: BC Managed Care – PPO

## 2012-03-21 DIAGNOSIS — I10 Essential (primary) hypertension: Secondary | ICD-10-CM

## 2012-03-21 DIAGNOSIS — Z125 Encounter for screening for malignant neoplasm of prostate: Secondary | ICD-10-CM

## 2012-03-21 LAB — COMPREHENSIVE METABOLIC PANEL
ALT: 17 U/L (ref 0–53)
AST: 15 U/L (ref 0–37)
Albumin: 4 g/dL (ref 3.5–5.2)
BUN: 16 mg/dL (ref 6–23)
Calcium: 9 mg/dL (ref 8.4–10.5)
Chloride: 104 mEq/L (ref 96–112)
Potassium: 4.2 mEq/L (ref 3.5–5.1)
Sodium: 142 mEq/L (ref 135–145)
Total Protein: 6.5 g/dL (ref 6.0–8.3)

## 2012-03-21 LAB — LIPID PANEL
Total CHOL/HDL Ratio: 3
VLDL: 25.2 mg/dL (ref 0.0–40.0)

## 2012-03-26 ENCOUNTER — Ambulatory Visit (INDEPENDENT_AMBULATORY_CARE_PROVIDER_SITE_OTHER): Payer: BC Managed Care – PPO | Admitting: Family Medicine

## 2012-03-26 ENCOUNTER — Encounter: Payer: Self-pay | Admitting: Family Medicine

## 2012-03-26 ENCOUNTER — Encounter: Payer: BC Managed Care – PPO | Admitting: Family Medicine

## 2012-03-26 VITALS — BP 132/70 | HR 60 | Temp 98.4°F | Ht 64.0 in | Wt 164.0 lb

## 2012-03-26 DIAGNOSIS — I499 Cardiac arrhythmia, unspecified: Secondary | ICD-10-CM

## 2012-03-26 DIAGNOSIS — I459 Conduction disorder, unspecified: Secondary | ICD-10-CM

## 2012-03-26 DIAGNOSIS — I10 Essential (primary) hypertension: Secondary | ICD-10-CM

## 2012-03-26 DIAGNOSIS — Z0000 Encounter for general adult medical examination without abnormal findings: Secondary | ICD-10-CM | POA: Insufficient documentation

## 2012-03-26 NOTE — Progress Notes (Signed)
CPE- See plan.  Routine anticipatory guidance given to patient.  See health maintenance. Tetanus 2006 Flu shot done at work.   PSA wnl.   Colonoscopy 2011 Diet and exercise discussed.  Lost weight intentionally over last year.  Living will d/w pt.  Wife is designated.    Hypertension:    Using medication without problems or lightheadedness: yes Chest pain with exertion:no Edema:no Short of breath:no Average home BPs: rarely checked out of clinic.    He has noted skipped beats prev over the years but no CP, syncope, presyncope.    PMH and SH reviewed  Meds, vitals, and allergies reviewed.   ROS: See HPI.  Otherwise negative.    GEN: nad, alert and oriented HEENT: mucous membranes moist NECK: supple w/o LA CV: frequent ectopy but not tachy PULM: ctab, no inc wob ABD: soft, +bs EXT: no edema SKIN: no acute rash

## 2012-03-26 NOTE — Assessment & Plan Note (Signed)
Routine anticipatory guidance given to patient.  See health maintenance. Tetanus 2006 Flu shot done at work.   PSA wnl.   Colonoscopy 2011 Diet and exercise discussed.  Lost weight intentionally over last year.  Living will d/w pt.  Wife is designated.

## 2012-03-26 NOTE — Patient Instructions (Addendum)
See Shirlee Limerick about your referral before you leave today. Don't change your meds for now.  Don't swim until you have seen cardiology and they agree for you to restart.

## 2012-03-26 NOTE — Assessment & Plan Note (Signed)
Controlled, labs d/w pt.  Continue current meds.  

## 2012-03-26 NOTE — Assessment & Plan Note (Signed)
EKG reviewed.  Advised not to swim until seen by cards. Not on any nodal agents.  I would like cards input.  He agrees.  No CP, edema, syncope, presyncope.

## 2012-04-11 HISTORY — PX: SHOULDER SURGERY: SHX246

## 2012-04-17 ENCOUNTER — Encounter: Payer: Self-pay | Admitting: *Deleted

## 2012-04-17 ENCOUNTER — Encounter: Payer: Self-pay | Admitting: Cardiovascular Disease

## 2012-04-18 ENCOUNTER — Ambulatory Visit (INDEPENDENT_AMBULATORY_CARE_PROVIDER_SITE_OTHER): Payer: BC Managed Care – PPO | Admitting: Cardiovascular Disease

## 2012-04-18 ENCOUNTER — Encounter: Payer: Self-pay | Admitting: Cardiovascular Disease

## 2012-04-18 VITALS — BP 138/84 | HR 96 | Ht 64.0 in | Wt 166.0 lb

## 2012-04-18 DIAGNOSIS — I499 Cardiac arrhythmia, unspecified: Secondary | ICD-10-CM

## 2012-04-18 DIAGNOSIS — I493 Ventricular premature depolarization: Secondary | ICD-10-CM

## 2012-04-18 DIAGNOSIS — I10 Essential (primary) hypertension: Secondary | ICD-10-CM

## 2012-04-18 DIAGNOSIS — I4949 Other premature depolarization: Secondary | ICD-10-CM

## 2012-04-18 NOTE — Patient Instructions (Signed)
Your physician has requested that you have a stress echocardiogram. For further information please visit [email or web address] Please follow instruction sheet as given.  We will see you as needed depending on results of echocardiogram.

## 2012-04-18 NOTE — Progress Notes (Signed)
   Patient ID: Donald [name], male    DOB: 1960-04-29, 52 y.o.   MRN: [id number]  HPI  Donald [name] is a 52 yo M with PMH of HTN presenting at request of Dr. Para March for evaluation of PVCs Pt states he has a known history of irregular heartbeat and had work up in 2008 including normal echo. He established care with Dr. Para March recently, who did EKG which showed frequent PVC and wanted further workup. Patient states he has no symptoms. He denies chest pain, pressure, SOB, weakness or syncope. He exercises at the [facility] 3 times per week with no problems. He does not smoke, drink alcohol, use illicit drugs. He drinks 2-3 caffeinated beverages per week.   He works as a Quarry manager at [employer]. He is married.  Past Medical History  Diagnosis Date  . Hypertension   . IBS (irritable bowel syndrome)     controlled with fiber supplement- diarrhea predominant  . Heart murmur     unremarkable echo 2006   Past Surgical History  Procedure Date  . Appendectomy 10 yoa    Review of Systems See HPI above. Otherwise negative   Physical Exam Gen: Awake, alert. NAD. Very pleasant HEENT: Atraumatic, normocephalic. Wearing glasses Neck: Supple, no LAD. No bruit appreciated Chest: No tenderness Cardio: Regular rate. Irregular rhythm. S1/S2 normal. 2/6 systolic murmur, louder after PVC. No gallops or rubs. Pulm: Good effort. CTAB Abd: Soft, nontender Ext: No edema Neuro: Grossly intact  EKG interpretation: Rate 60 bmp. Sinus rhythm with occasional PVC. Otherwise normal.

## 2012-04-18 NOTE — Assessment & Plan Note (Signed)
Well controlled.  Continue current medications and low sodium Dash type diet.    

## 2012-04-18 NOTE — Assessment & Plan Note (Signed)
Patient asymptomatic. Likely benign. Echo 2008 normal. Will order stress echo to evaluate heart function. Patient may return to normal activities, including swimming. Continue anti-hypertensives and follow up with PCP.

## 2012-04-20 ENCOUNTER — Ambulatory Visit (HOSPITAL_COMMUNITY): Payer: BC Managed Care – PPO | Attending: Internal Medicine

## 2012-04-20 ENCOUNTER — Ambulatory Visit (HOSPITAL_BASED_OUTPATIENT_CLINIC_OR_DEPARTMENT_OTHER): Payer: BC Managed Care – PPO

## 2012-04-20 ENCOUNTER — Encounter: Payer: Self-pay | Admitting: Cardiovascular Disease

## 2012-04-20 DIAGNOSIS — I493 Ventricular premature depolarization: Secondary | ICD-10-CM

## 2012-04-20 DIAGNOSIS — I4949 Other premature depolarization: Secondary | ICD-10-CM | POA: Insufficient documentation

## 2012-04-20 DIAGNOSIS — I10 Essential (primary) hypertension: Secondary | ICD-10-CM | POA: Insufficient documentation

## 2012-04-20 DIAGNOSIS — R0989 Other specified symptoms and signs involving the circulatory and respiratory systems: Secondary | ICD-10-CM

## 2012-04-20 NOTE — Progress Notes (Signed)
Echocardiogram performed.  

## 2012-04-30 ENCOUNTER — Other Ambulatory Visit: Payer: Self-pay | Admitting: Family Medicine

## 2012-05-01 ENCOUNTER — Telehealth: Payer: Self-pay | Admitting: Cardiovascular Disease

## 2012-05-01 NOTE — Telephone Encounter (Signed)
PT  AWARE OF STRESS ECHO RESULTS ./CY 

## 2012-05-01 NOTE — Telephone Encounter (Signed)
Pt rtn christine's call

## 2012-12-12 ENCOUNTER — Emergency Department (HOSPITAL_COMMUNITY)
Admission: EM | Admit: 2012-12-12 | Discharge: 2012-12-12 | Disposition: A | Discharge status: Home / Self Care | Payer: BC Managed Care – PPO | Attending: Emergency Medicine | Admitting: Emergency Medicine

## 2012-12-12 ENCOUNTER — Encounter (HOSPITAL_COMMUNITY): Payer: Self-pay | Admitting: Emergency Medicine

## 2012-12-12 ENCOUNTER — Telehealth: Payer: Self-pay | Admitting: Family Medicine

## 2012-12-12 ENCOUNTER — Emergency Department (HOSPITAL_COMMUNITY): Payer: BC Managed Care – PPO

## 2012-12-12 DIAGNOSIS — R011 Cardiac murmur, unspecified: Secondary | ICD-10-CM | POA: Insufficient documentation

## 2012-12-12 DIAGNOSIS — I10 Essential (primary) hypertension: Secondary | ICD-10-CM | POA: Insufficient documentation

## 2012-12-12 DIAGNOSIS — I498 Other specified cardiac arrhythmias: Secondary | ICD-10-CM

## 2012-12-12 DIAGNOSIS — Z7982 Long term (current) use of aspirin: Secondary | ICD-10-CM | POA: Insufficient documentation

## 2012-12-12 DIAGNOSIS — Z79899 Other long term (current) drug therapy: Secondary | ICD-10-CM | POA: Insufficient documentation

## 2012-12-12 DIAGNOSIS — Z8719 Personal history of other diseases of the digestive system: Secondary | ICD-10-CM | POA: Insufficient documentation

## 2012-12-12 LAB — CBC
HCT: 44.7 % (ref 39.0–52.0)
Hemoglobin: 16.3 g/dL (ref 13.0–17.0)
MCH: 32.5 pg (ref 26.0–34.0)
MCHC: 36.5 g/dL — ABNORMAL HIGH (ref 30.0–36.0)
MCV: 89.2 fL (ref 78.0–100.0)
Platelets: 195 10*3/uL (ref 150–400)
RBC: 5.01 MIL/uL (ref 4.22–5.81)
RDW: 13 % (ref 11.5–15.5)
WBC: 8.2 10*3/uL (ref 4.0–10.5)

## 2012-12-12 LAB — BASIC METABOLIC PANEL
BUN: 18 mg/dL (ref 6–23)
Chloride: 105 mEq/L (ref 96–112)
GFR calc Af Amer: >90 mL/min (ref >90)
Potassium: 3.3 mEq/L — ABNORMAL LOW (ref 3.5–5.1)

## 2012-12-12 LAB — POCT I-STAT TROPONIN I: Troponin i, poc: 0 ng/mL (ref 0.00–0.08)

## 2012-12-12 LAB — MAGNESIUM: Magnesium: 2.3 mg/dL (ref 1.5–2.5)

## 2012-12-12 NOTE — ED Provider Notes (Signed)
CSN: [id number]     Arrival date & time 12/12/12  1451 History   First MD Initiated Contact with Patient 12/12/12 1638     Chief Complaint  Patient presents with  . Bradycardia   (Consider location/radiation/quality/duration/timing/severity/associated sxs/prior Treatment) HPI Patient was sent in by his pain for low heart rate. States she was to bring a blood pressure machine found.his heart rate was in the 50s and 40s. Is a previous history of palpitations had a negative carotid echo 8 months ago. He has seen cardiology. No lightheadedness or dizziness. No symptoms. No chest pain. No difficulty breathing. No change in medications besides some pain medications for a left shoulder. 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 Past Medical History  Diagnosis Date  . Hypertension   . IBS (irritable bowel syndrome)     controlled with fiber supplement- diarrhea predominant  . Heart murmur     unremarkable echo 2006   Past Surgical History  Procedure Laterality Date  . Appendectomy  10 yoa   Family History  Problem Relation Age of Onset  . Kidney disease Mother   . Hypertension Mother     secondary  . Hyperlipidemia Mother   . Heart disease Father     MI X 2, 58/60, PTCA  . Hypertension Father   . Obesity Sister   . Hypertension Brother   . Lung disease Brother     past smoker  . Heart disease Maternal Grandmother     MI  . Heart disease Maternal Grandfather     MI  . Cancer Paternal Grandfather     prostate  . Hypertension Brother   . Colon cancer Neg Hx   . Prostate cancer Neg Hx    History  Substance Use Topics  . Smoking status: Never Smoker   . Smokeless tobacco: Never Used  . Alcohol Use: No    Review of Systems  Constitutional: Negative for activity change and appetite change.  HENT: Negative for neck stiffness.   Eyes: Negative for pain.  Respiratory: Negative for chest tightness and shortness of breath.    Cardiovascular: Positive for palpitations. Negative for chest pain and leg swelling.  Gastrointestinal: Negative for nausea, vomiting, abdominal pain and diarrhea.  Genitourinary: Negative for flank pain.  Musculoskeletal: Negative for back pain.  Skin: Negative for rash.  Neurological: Negative for weakness, numbness and headaches.  Psychiatric/Behavioral: Negative for behavioral problems.    Allergies  Review of patient's allergies indicates no known allergies.  Home Medications   Current Outpatient Rx  Name  Route  Sig  Dispense  Refill  . aspirin 81 MG tablet      Take 1 tablet by mouth every other day.         . celecoxib (CELEBREX) 200 MG capsule   Oral   Take 200 mg by mouth daily.         . Flaxseed, Linseed, (FLAX SEED OIL) 1000 MG CAPS   Oral   Take 1 capsule by mouth daily.          . Garlic Oil 1000 MG CAPS   Oral   Take 1,000 mg by mouth daily.          . hydrochlorothiazide (MICROZIDE) 12.5 MG capsule      TAKE ONE CAPSULE BY MOUTH IN THE MORNING   90 capsule   2   . HYDROcodone-acetaminophen (NORCO/VICODIN) 5-325 MG per tablet   Oral  Take 0.5 tablets by mouth at bedtime as needed for pain.         Marland Kitchen lisinopril (PRINIVIL,ZESTRIL) 20 MG tablet   Oral   Take 20 mg by mouth daily with supper.         . Magnesium 250 MG TABS   Oral   Take 250 mg by mouth daily.          . Multiple Vitamin (MULTIVITAMIN PO)   Oral   Take by mouth daily.           . vitamin C (ASCORBIC ACID) 500 MG tablet   Oral   Take 500 mg by mouth daily.            BP 134/70  Pulse 56  Temp(Src) 98.1 F (36.7 C) (Oral)  Resp 16  SpO2 99% Physical Exam  Nursing note and vitals reviewed. Constitutional: He is oriented to person, place, and time. He appears well-developed and well-nourished.  HENT:  Head: Normocephalic and atraumatic.  Eyes: EOM are normal. Pupils are equal, round, and reactive to light.  Neck: Normal range of motion. Neck supple.   Cardiovascular: Normal rate and normal heart sounds.   No murmur heard. Mild irregularity.  Pulmonary/Chest: Effort normal and breath sounds normal.  Abdominal: Soft. Bowel sounds are normal. He exhibits no distension and no mass. There is no tenderness. There is no rebound and no guarding.  Musculoskeletal: Normal range of motion. He exhibits no edema.  Neurological: He is alert and oriented to person, place, and time. No cranial nerve deficit.  Skin: Skin is warm and dry.  Psychiatric: He has a normal mood and affect.    ED Course  Procedures (including critical care time) Labs Review Labs Reviewed  CBC - Abnormal; Notable for the following:    MCHC 36.5 (*)    All other components within normal limits  BASIC METABOLIC PANEL - Abnormal; Notable for the following:    Potassium 3.3 (*)    All other components within normal limits  MAGNESIUM  POCT I-STAT TROPONIN I   Imaging Review Dg Chest 2 View  12/12/2012   *RADIOLOGY REPORT*  Clinical Data: Bradycardia  CHEST - 2 VIEW  Comparison: None.  Findings:  Lungs clear.  Heart size and pulmonary vascularity are normal.  No adenopathy.  No bone lesions.  IMPRESSION: No abnormality noted.   Original Report Authenticated By: Bretta Bang, M.D.    Date: 12/12/2012  Rate: 65  Rhythm: ventricular trigeminy  QRS Axis: normal  Intervals: normal  ST/T Wave abnormalities: normal  Conduction Disutrbances:none  Narrative Interpretation: PVC more frequent  Old EKG Reviewed: changes noted  MDM    1. Ventricular trigeminy    Patient with mild bradycardia. He also is in ventricular bigeminy or trigeminy. He has good blood pressure and does not hypotensive. He is asymptomatic except for occasional feeling of palpitations. He has a recent negative 2-D echo. He'll be discharged home to followup as needed.    Juliet Rude. Rubin Payor, MD 12/12/12 1756

## 2012-12-12 NOTE — Telephone Encounter (Signed)
Agee with ER eval.

## 2012-12-12 NOTE — ED Notes (Signed)
Pt here c/o noted bradycardia when checked pulse; pt denies any complaint; pt sts hx of irregular HR

## 2012-12-12 NOTE — Telephone Encounter (Signed)
Patient Information:  Caller Name: Jayten  Phone: [phone]  Patient: Donald [name], Donald [name]  Gender: Male  DOB: November 07, 1960  Age: 52 Years  PCP: Crawford Givens Clelia Croft) [facility])  Office Follow Up:  Does the office need to follow up with this patient?: No  Instructions For The Office: N/A  RN Note:  Sent to ED  Symptoms  Reason For Call & Symptoms: Patient calling, has noticed a drop in his heart rate over the past several weeks.  Same is around 40 with b/p 120/70.  Denies any dizziness, chest pain or shortness of breathe.  Has noticed increased fatigue.  He is being treated by ortho for a "frozen shoulder" and on Celebrex and Norco.  Reviewed Health History In EMR: Yes  Reviewed Medications In EMR: Yes  Reviewed Allergies In EMR: Yes  Reviewed Surgeries / Procedures: Yes  Date of Onset of Symptoms: 11/28/2012  Guideline(s) Used:  Heart Rate and Heartbeat Questions  Disposition Per Guideline:   Go to ED Now  Reason For Disposition Reached:   Heart beating very slowly (e.g., < 50 / minute) (EXCEPTION: athlete)  Advice Given:  N/A  Patient Will Follow Care Advice:  YES

## 2012-12-17 ENCOUNTER — Telehealth: Payer: Self-pay | Admitting: Cardiovascular Disease

## 2012-12-17 ENCOUNTER — Telehealth: Payer: Self-pay

## 2012-12-17 NOTE — Telephone Encounter (Signed)
I would still keep the appointment with cards.  He had PVCs and I would talk to cards about this.

## 2012-12-17 NOTE — Telephone Encounter (Signed)
Message copied by Alois Cliche on Mon Dec 17, 2012 12:35 PM ------      Message from: Wendall Stade      Created: Sun Dec 16, 2012  9:49 PM       He has normal EF and recent normla stress echo and the PVC;s are mostly asymptomatic Dont think he needs anything else  Can have EP way in            Wynona Canes can you get him a f/u with EP next available      ----- Message -----         From: Joaquim Nam, MD         Sent: 12/12/2012  11:01 PM           To: Wendall Stade, MD            Pt recently in ER, looks to be in trigeminy per EKG done at ER.  Do you need to see him back?  Thanks for your input.              Clelia Croft             ------

## 2012-12-17 NOTE — Telephone Encounter (Signed)
On 12/12/12 pt was seen in ED and heart rate was between 55-60. Pt said the pulse at work of 40 must have been a mis read. Pt wants to know if still needs to see cardiologist. Pt said he feels fine and thinks since P 40 was a misread and does not think he needs to see cardiologist.Please advise.

## 2012-12-17 NOTE — Telephone Encounter (Signed)
Patient says that he spoke with Dr. Fabio Bering nurse today and she said that the PVC's showed up last January but Dr. Eden Emms did not see anything to be alarmed about.  The patient says he will certainly see Cardiology if you think it is necessary but wanted you to know what the nurse had said prior to making the appt.  Please advise.

## 2012-12-17 NOTE — Telephone Encounter (Signed)
PT AWARE  MESSAGE FORWARDED TO  EP SCHEDULER./CY

## 2012-12-17 NOTE — Telephone Encounter (Signed)
New problem   Patient calling back regarding a message left for him to make an appt with Dr Graciela Husbands.    Patient was at work pulse rate was misread by nurse at work  Heart rate  40. Went to er monitor shows  55-60 . Patient doesn't think he need this appt.  Please advise

## 2012-12-18 NOTE — Telephone Encounter (Signed)
I will defer to cards.  My understanding was that the patient was to follow up with them.  Will also route to cards to close this loop.

## 2012-12-19 ENCOUNTER — Telehealth: Payer: Self-pay | Admitting: Cardiovascular Disease

## 2012-12-19 NOTE — Telephone Encounter (Signed)
Walk in pt Form "[facility]" Clearance Dropped Off gave to Message Nurse 12/19/12/KM

## 2013-01-30 ENCOUNTER — Other Ambulatory Visit: Payer: Self-pay | Admitting: Family Medicine

## 2013-02-14 ENCOUNTER — Other Ambulatory Visit: Payer: Self-pay

## 2013-03-10 ENCOUNTER — Other Ambulatory Visit: Payer: Self-pay | Admitting: Family Medicine

## 2013-03-10 DIAGNOSIS — E7800 Pure hypercholesterolemia, unspecified: Secondary | ICD-10-CM

## 2013-03-11 ENCOUNTER — Other Ambulatory Visit: Payer: Self-pay | Admitting: Family Medicine

## 2013-03-21 ENCOUNTER — Other Ambulatory Visit (INDEPENDENT_AMBULATORY_CARE_PROVIDER_SITE_OTHER): Payer: BC Managed Care – PPO

## 2013-03-21 DIAGNOSIS — E7800 Pure hypercholesterolemia, unspecified: Secondary | ICD-10-CM

## 2013-03-21 LAB — LIPID PANEL
HDL: 75.6 mg/dL (ref >39.00)
Triglycerides: 129 mg/dL (ref 0.0–149.0)

## 2013-03-28 ENCOUNTER — Encounter: Payer: Self-pay | Admitting: Family Medicine

## 2013-03-28 ENCOUNTER — Ambulatory Visit (INDEPENDENT_AMBULATORY_CARE_PROVIDER_SITE_OTHER): Payer: BC Managed Care – PPO | Admitting: Family Medicine

## 2013-03-28 VITALS — BP 142/80 | HR 56 | Temp 98.3°F | Ht 64.0 in | Wt 174.5 lb

## 2013-03-28 DIAGNOSIS — E7800 Pure hypercholesterolemia, unspecified: Secondary | ICD-10-CM

## 2013-03-28 DIAGNOSIS — Z0000 Encounter for general adult medical examination without abnormal findings: Secondary | ICD-10-CM

## 2013-03-28 DIAGNOSIS — R739 Hyperglycemia, unspecified: Secondary | ICD-10-CM

## 2013-03-28 DIAGNOSIS — I10 Essential (primary) hypertension: Secondary | ICD-10-CM

## 2013-03-28 DIAGNOSIS — Z23 Encounter for immunization: Secondary | ICD-10-CM

## 2013-03-28 DIAGNOSIS — I499 Cardiac arrhythmia, unspecified: Secondary | ICD-10-CM

## 2013-03-28 MED ORDER — LISINOPRIL 20 MG PO TABS: ORAL_TABLET | ORAL | Status: DC

## 2013-03-28 MED ORDER — HYDROCHLOROTHIAZIDE 12.5 MG PO CAPS: 12.5000 mg | ORAL_CAPSULE | Freq: Every day | ORAL | Status: DC

## 2013-03-28 NOTE — Assessment & Plan Note (Signed)
Normal on last check, d/w pt.

## 2013-03-28 NOTE — Progress Notes (Signed)
Pre-visit discussion using our clinic review tool. No additional management support is needed unless otherwise documented below in the visit note.  CPE- See plan.  Routine anticipatory guidance given to patient.  See health maintenance. Tetanus 2006 Flu shot done today.  Colonoscopy 2011 Prostate cancer screening and PSA options (with potential risks and benefits of testing vs not testing) were discussed along with recent recs/guidelines.  He declined testing PSA at this point.   Living will d/w pt.  Wife would be designated if incapacitated.  Diet d/w pt.   Exercise- walking, not swimming.  H/o shoulder surgery.  Slowly improving L shoulder ROM.   Hypertension:    Using medication without problems or lightheadedness: yes Chest pain with exertion:no Edema:no Short of breath:no No syncope. No skipped beats noted by patient.   PMH and SH reviewed  Meds, vitals, and allergies reviewed.   ROS: See HPI.  Otherwise negative.    GEN: nad, alert and oriented HEENT: mucous membranes moist NECK: supple w/o LA CV: rrr. PULM: ctab, no inc wob ABD: soft, +bs EXT: no edema SKIN: no acute rash

## 2013-03-28 NOTE — Patient Instructions (Signed)
Take care.  Good luck with your shoulder exercises.  Glad to see you.

## 2013-03-28 NOTE — Addendum Note (Signed)
Addended by: Annamarie Major on: 03/28/2013 12:07 PM   Modules accepted: Orders

## 2013-03-28 NOTE — Assessment & Plan Note (Signed)
Usually controlled.  Mildly brady today.  Feeling well.  BP likely up some from pain.  Continue current meds.

## 2013-03-28 NOTE — Assessment & Plan Note (Signed)
Prev neg stress echo noted. No syncope.  Feels well.

## 2013-03-28 NOTE — Assessment & Plan Note (Signed)
Routine anticipatory guidance given to patient. See health maintenance.  Tetanus 2006  Flu shot done today.  Colonoscopy 2011  Prostate cancer screening and PSA options (with potential risks and benefits of testing vs not testing) were discussed along with recent recs/guidelines. He declined testing PSA at this point.  Living will d/w pt. Wife would be designated if incapacitated.  Diet d/w pt.  Exercise- walking, not swimming now. H/o shoulder surgery. Slowly improving L shoulder ROM.

## 2013-03-28 NOTE — Assessment & Plan Note (Signed)
Lipids okay, high HDL noted.

## 2013-09-17 ENCOUNTER — Ambulatory Visit (INDEPENDENT_AMBULATORY_CARE_PROVIDER_SITE_OTHER): Payer: BC Managed Care – PPO | Admitting: Family Medicine

## 2013-09-17 ENCOUNTER — Encounter: Payer: Self-pay | Admitting: Family Medicine

## 2013-09-17 VITALS — BP 136/68 | HR 62 | Temp 98.3°F | Wt 181.2 lb

## 2013-09-17 DIAGNOSIS — J069 Acute upper respiratory infection, unspecified: Secondary | ICD-10-CM | POA: Insufficient documentation

## 2013-09-17 MED ORDER — AMOXICILLIN-POT CLAVULANATE 875-125 MG PO TABS: 1.0000 | ORAL_TABLET | Freq: Two times a day (BID) | ORAL | Status: DC

## 2013-09-17 NOTE — Assessment & Plan Note (Signed)
Nontoxic, given the duration would treat, start augmentin, continue nasal saline and OTC cold meds.  F/u prn.  He agrees.

## 2013-09-17 NOTE — Patient Instructions (Signed)
Start the antibiotics today and use nasal saline.  This should get better soon.  Take care.

## 2013-09-17 NOTE — Progress Notes (Signed)
Pre visit review using our clinic review tool, if applicable. No additional management support is needed unless otherwise documented below in the visit note.  duration of symptoms: 2+ weeks rhinorrhea:yes congestion:yes ear pain:no sore throat:no Cough: yes, throat clearing Myalgias: occ Discolored rhinorrhea, prev with fevers.    ROS: See HPI.  Otherwise negative.    Meds, vitals, and allergies reviewed.   GEN: nad, alert and oriented HEENT: mucous membranes moist, TM w/o erythema, nasal epithelium injected, OP with cobblestoning NECK: supple w/o LA CV: rrr. PULM: ctab, no inc wob ABD: soft, +bs EXT: no edema

## 2013-12-05 ENCOUNTER — Ambulatory Visit: Payer: BC Managed Care – PPO | Admitting: Internal Medicine

## 2013-12-06 ENCOUNTER — Ambulatory Visit (INDEPENDENT_AMBULATORY_CARE_PROVIDER_SITE_OTHER): Payer: BC Managed Care – PPO | Admitting: Family Medicine

## 2013-12-06 ENCOUNTER — Encounter: Payer: Self-pay | Admitting: Family Medicine

## 2013-12-06 VITALS — BP 132/80 | HR 52 | Temp 98.2°F | Wt 179.5 lb

## 2013-12-06 DIAGNOSIS — I499 Cardiac arrhythmia, unspecified: Secondary | ICD-10-CM

## 2013-12-06 DIAGNOSIS — H9311 Tinnitus, right ear: Secondary | ICD-10-CM

## 2013-12-06 DIAGNOSIS — H9319 Tinnitus, unspecified ear: Secondary | ICD-10-CM

## 2013-12-06 MED ORDER — FLUTICASONE PROPIONATE 50 MCG/ACT NA SUSP: 2.0000 | Freq: Every day | NASAL | Status: DC

## 2013-12-06 NOTE — Assessment & Plan Note (Addendum)
Abnormal heart rhythm at baseline, longstanding. S/p normal echo 2006 and stress echo in last few yrs.

## 2013-12-06 NOTE — Progress Notes (Signed)
BP 132/80  Pulse 52  Temp(Src) 98.2 F (36.8 C) (Oral)  Wt 179 lb 8 oz (81.421 kg)   CC: tinnitus  Subjective:    Patient ID: Donald [name], male    DOB: 07-13-1960, 53 y.o.   MRN: [id number]  HPI: Donald [name] is a 53 y.o. male presenting on 12/06/2013 for Tinnitus   [facility] pt of Dr Lianne Bushy presents today to discuss right sided tinnitus that started Sunday night. Went to nurse at work who thought she saw fluid behind R ear. Started on antihistamine which didn't help (chlortrimeton) tried this for 2 days. Ringing described as far away siren. Worse when talking or listening to radio or TV. Swimmer but uses plugs in ears and nose to swim. No hearing changes, pain in ear, drainage from ear, dizziness, nausea, no significant nasal congestion. When he lays down on left side ringing goes away.  No h/o tinnitus in past. No significant noise exposure in past, actually he has been careful around loud sounds in the past - ie uses ear plugs when mowing lawn  Relevant past medical, surgical, family and social history reviewed and updated as indicated.  Allergies and medications reviewed and updated. Current Outpatient Prescriptions on File Prior to Visit  Medication Sig  . aspirin 81 MG tablet Take 1 tablet by mouth every other day.  . Flaxseed, Linseed, (FLAX SEED OIL) 1000 MG CAPS Take 1 capsule by mouth daily.   . Garlic Oil 1000 MG CAPS Take 1,000 mg by mouth daily.   . hydrochlorothiazide (MICROZIDE) 12.5 MG capsule Take 1 capsule (12.5 mg total) by mouth daily.  Marland Kitchen lisinopril (PRINIVIL,ZESTRIL) 20 MG tablet TAKE ONE TABLET BY MOUTH ONCE DAILY.  . Multiple Vitamin (MULTIVITAMIN PO) Take by mouth daily.    . vitamin C (ASCORBIC ACID) 500 MG tablet Take 500 mg by mouth daily.     No current facility-administered medications on file prior to visit.    Review of Systems Per HPI unless specifically indicated above    Objective:    BP 132/80  Pulse 52  Temp(Src) 98.2 F (36.8 C)  (Oral)  Wt 179 lb 8 oz (81.421 kg)  Physical Exam  Nursing note and vitals reviewed. Constitutional: He is oriented to person, place, and time. He appears well-developed and well-nourished. No distress.  HENT:  Right Ear: Hearing, tympanic membrane, external ear and ear canal normal.  Left Ear: Hearing, tympanic membrane, external ear and ear canal normal.  Nose: Nose normal. No mucosal edema or rhinorrhea.  Mouth/Throat: Uvula is midline, oropharynx is clear and moist and mucous membranes are normal. No oropharyngeal exudate, posterior oropharyngeal edema, posterior oropharyngeal erythema or tonsillar abscesses.  Eyes: Conjunctivae and EOM are normal. Pupils are equal, round, and reactive to light.  Neck: Normal range of motion. Neck supple. Carotid bruit is not present. No thyromegaly present.  Cardiovascular: Intact distal pulses.  An irregular rhythm present. Bradycardia present.   Murmur (2/6 faint systolic extra heart sound) heard. Per patient chronic changes  Pulmonary/Chest: Effort normal and breath sounds normal. No respiratory distress. He has no wheezes. He has no rales.  Lymphadenopathy:    He has no cervical adenopathy.  Neurological: He is alert and oriented to person, place, and time. No cranial nerve deficit.  CN 2-12 intact   Results for orders placed in visit on 03/21/13  LIPID PANEL      Result Value Ref Range   Cholesterol 211 (*) 0 - 200 mg/dL   Triglycerides  129.0  0.0 - 149.0 mg/dL   HDL 16.10  >96.04 mg/dL   VLDL 54.0  0.0 - 98.1 mg/dL   Total CHOL/HDL Ratio 3    LDL CHOLESTEROL, DIRECT      Result Value Ref Range   Direct LDL 120.7        Assessment & Plan:   Problem List Items Addressed This Visit   Arrhythmia     Abnormal heart rhythm at baseline, longstanding. S/p normal echo 2006 and stress echo in last few yrs.    Right-sided tinnitus - Primary     Unilateral tinnitus worse with loud ringing Check hearing screen today - and if abnormal will  refer to ENT. ?ETD so will start flonase daily for 7-10 days. If not better with this, advised to notify us for referral to ENT as well Pt agrees with plan. No carotid bruits appreciated on exam today. Doubt med related as unilateral.  No significant congestion noted. I don't see serous otitis today.       Audiometry - failed R side at 1000 Hz otherwise heard all Hz bilateral ears at 40 dB  Follow up plan: No Follow-up on file.

## 2013-12-06 NOTE — Patient Instructions (Signed)
I wonder if this is coming from eustachian tube dysfunction leading to congestion of right ear. Hearing overall ok today. Trial of flonase 2 squirts daily into nose for next 7-10 days. If not better with flonase, call us for referral to ENT. Good to meet you today, call us with questions.  Tinnitus Sounds you hear in your ears and coming from within the ear is called tinnitus. This can be a symptom of many ear disorders. It is often associated with hearing loss.  Tinnitus can be seen with:  Infections.  Ear blockages such as wax buildup.  Meniere's disease.  Ear damage.  Inherited.  Occupational causes. While irritating, it is not usually a threat to health. When the cause of the tinnitus is wax, infection in the middle ear, or foreign body it is easily treated. Hearing loss will usually be reversible.  TREATMENT  When treating the underlying cause does not get rid of tinnitus, it may be necessary to get rid of the unwanted sound by covering it up with more pleasant background noises. This may include music, the radio etc. There are tinnitus maskers which can be worn which produce background noise to cover up the tinnitus. Avoid all medications which tend to make tinnitus worse such as alcohol, caffeine, aspirin, and nicotine. There are many soothing background tapes such as rain, ocean, thunderstorms, etc. These soothing sounds help with sleeping or resting. Keep all follow-up appointments and referrals. This is important to identify the cause of the problem. It also helps avoid complications, impaired hearing, disability, or chronic pain. Document Released: 03/28/2005 Document Revised: 06/20/2011 Document Reviewed: 11/14/2007 [facility] Patient Information 2015 [place], [place]. This information is not intended to replace advice given to you by your health care provider. Make sure you discuss any questions you have with your health care provider.

## 2013-12-06 NOTE — Assessment & Plan Note (Addendum)
Unilateral tinnitus worse with loud ringing Check hearing screen today - and if abnormal will refer to ENT. ?ETD so will start flonase daily for 7-10 days. If not better with this, advised to notify us for referral to ENT as well Pt agrees with plan. No carotid bruits appreciated on exam today. Doubt med related as unilateral.  No significant congestion noted. I don't see serous otitis today.

## 2013-12-06 NOTE — Progress Notes (Signed)
Pre visit review using our clinic review tool, if applicable. No additional management support is needed unless otherwise documented below in the visit note. 

## 2014-03-23 ENCOUNTER — Other Ambulatory Visit: Payer: Self-pay | Admitting: Family Medicine

## 2014-03-23 DIAGNOSIS — I10 Essential (primary) hypertension: Secondary | ICD-10-CM

## 2014-03-24 ENCOUNTER — Other Ambulatory Visit: Payer: BC Managed Care – PPO

## 2014-03-25 ENCOUNTER — Other Ambulatory Visit (INDEPENDENT_AMBULATORY_CARE_PROVIDER_SITE_OTHER): Payer: BC Managed Care – PPO

## 2014-03-25 DIAGNOSIS — I10 Essential (primary) hypertension: Secondary | ICD-10-CM

## 2014-03-25 LAB — LIPID PANEL
CHOL/HDL RATIO: 4
Cholesterol: 219 mg/dL — ABNORMAL HIGH (ref 0–200)
HDL: 51.3 mg/dL (ref >39.00)
LDL CALC: 137 mg/dL — AB (ref 0–99)
NonHDL: 167.7
TRIGLYCERIDES: 152 mg/dL — AB (ref 0.0–149.0)
VLDL: 30.4 mg/dL (ref 0.0–40.0)

## 2014-03-25 LAB — COMPREHENSIVE METABOLIC PANEL
ALBUMIN: 4 g/dL (ref 3.5–5.2)
ALK PHOS: 72 U/L (ref 39–117)
ALT: 30 U/L (ref 0–53)
AST: 23 U/L (ref 0–37)
BUN: 15 mg/dL (ref 6–23)
CO2: 30 mEq/L (ref 19–32)
CREATININE: 1 mg/dL (ref 0.4–1.5)
Calcium: 9 mg/dL (ref 8.4–10.5)
Chloride: 104 mEq/L (ref 96–112)
GFR: 84.34 mL/min (ref >60.00)
Glucose, Bld: 96 mg/dL (ref 70–99)
POTASSIUM: 4.4 meq/L (ref 3.5–5.1)
Sodium: 138 mEq/L (ref 135–145)
Total Bilirubin: 0.4 mg/dL (ref 0.2–1.2)
Total Protein: 6.8 g/dL (ref 6.0–8.3)

## 2014-03-31 ENCOUNTER — Encounter: Payer: Self-pay | Admitting: Family Medicine

## 2014-03-31 ENCOUNTER — Ambulatory Visit (INDEPENDENT_AMBULATORY_CARE_PROVIDER_SITE_OTHER): Payer: BC Managed Care – PPO | Admitting: Family Medicine

## 2014-03-31 VITALS — BP 140/70 | HR 60 | Temp 98.4°F | Ht 64.0 in | Wt 179.8 lb

## 2014-03-31 DIAGNOSIS — I10 Essential (primary) hypertension: Secondary | ICD-10-CM

## 2014-03-31 DIAGNOSIS — M720 Palmar fascial fibromatosis [Dupuytren]: Secondary | ICD-10-CM | POA: Insufficient documentation

## 2014-03-31 DIAGNOSIS — Z7189 Other specified counseling: Secondary | ICD-10-CM | POA: Insufficient documentation

## 2014-03-31 DIAGNOSIS — Z0000 Encounter for general adult medical examination without abnormal findings: Secondary | ICD-10-CM

## 2014-03-31 MED ORDER — HYDROCHLOROTHIAZIDE 12.5 MG PO CAPS: 12.5000 mg | ORAL_CAPSULE | Freq: Every day | ORAL | Status: DC

## 2014-03-31 MED ORDER — LISINOPRIL 20 MG PO TABS: ORAL_TABLET | ORAL | Status: DC

## 2014-03-31 NOTE — Assessment & Plan Note (Signed)
Reasonable control, likely white coat component.  Controlled at home, continue as is, continue work on diet and exercise.

## 2014-03-31 NOTE — Assessment & Plan Note (Addendum)
Routine anticipatory guidance given to patient. See health maintenance.  Tetanus 2006  Flu shot done at work.  PNA and shingles shot not due, d/w pt.  Colonoscopy 2011  Prostate cancer screening and PSA options (with potential risks and benefits of testing vs not testing) were discussed along with recent recs/guidelines. He declined testing PSA at this point.  Living will d/w pt. Wife would be designated if incapacitated.  Diet d/w pt.  Exercise- walking prev, back to swimming. H/o shoulder surgery but he is back to swimming and doing well.  He'll monitor the macule on the R triceps area.

## 2014-03-31 NOTE — Progress Notes (Signed)
Pre visit review using our clinic review tool, if applicable. No additional management support is needed unless otherwise documented below in the visit note.  CPE- See plan.  Routine anticipatory guidance given to patient.  See health maintenance. Tetanus 2006 Flu shot done at work.  PNA and shingles shot not due, d/w pt.   Colonoscopy 2011 Prostate cancer screening and PSA options (with potential risks and benefits of testing vs not testing) were discussed along with recent recs/guidelines.  He declined testing PSA at this point. Living will d/w pt. Wife would be designated if incapacitated.  Diet d/w pt.  Exercise- walking prev, back to swimming. H/o shoulder surgery but he is back to swimming and doing well.   Hypertension:  Using medication without problems or lightheadedness: yes Chest pain with exertion:no Edema:no Short of breath:no No syncope. No skipped beats noted by patient.  Normally his BP is controlled on checks at home.  Labs d/w pt. Minimal elevation in lipids, sugar and Cr wnl.   PMH and SH reviewed  Meds, vitals, and allergies reviewed.   ROS: See HPI.  Otherwise negative.    GEN: nad, alert and oriented HEENT: mucous membranes moist NECK: supple w/o LA CV: rrr. Soft SEM noted.  PULM: ctab, no inc wob ABD: soft, +bs EXT: no edema SKIN: no acute rash L palmar dupuytren contracture, mild, no loss of ROM, still has full extension.   6mm fleshy macule on R triceps area, unchanged per patient report.  He'll monitor this.

## 2014-03-31 NOTE — Patient Instructions (Addendum)
Take care.  Glad to see you.  Recheck in about 1 year, labs ahead of time.  Keep exercising and keep working on your weight.

## 2014-03-31 NOTE — Assessment & Plan Note (Signed)
No intervention needed, has full extension, no pain.  If worsening then he'll notify me and we'll refer. He agrees.

## 2014-04-02 ENCOUNTER — Telehealth: Payer: Self-pay | Admitting: Family Medicine

## 2014-04-02 NOTE — Telephone Encounter (Signed)
emmi emailed °

## 2014-06-16 ENCOUNTER — Encounter: Payer: Self-pay | Admitting: Internal Medicine

## 2014-06-18 ENCOUNTER — Encounter: Payer: Self-pay | Admitting: Internal Medicine

## 2014-08-05 ENCOUNTER — Ambulatory Visit (AMBULATORY_SURGERY_CENTER): Payer: Self-pay

## 2014-08-05 VITALS — Ht 64.0 in | Wt 183.2 lb

## 2014-08-05 DIAGNOSIS — Z86010 Personal history of colonic polyps: Secondary | ICD-10-CM

## 2014-08-05 MED ORDER — MOVIPREP 100 G PO SOLR: ORAL | Status: DC

## 2014-08-05 NOTE — Progress Notes (Signed)
Per pt, no allergies to soy or egg products.Pt not taking any weight loss meds or using  O2 at home. 

## 2014-08-13 ENCOUNTER — Encounter: Payer: Self-pay | Admitting: Internal Medicine

## 2014-08-19 ENCOUNTER — Ambulatory Visit (AMBULATORY_SURGERY_CENTER): Payer: BLUE CROSS/BLUE SHIELD | Admitting: Internal Medicine

## 2014-08-19 ENCOUNTER — Encounter: Payer: Self-pay | Admitting: Internal Medicine

## 2014-08-19 VITALS — BP 115/60 | HR 53 | Temp 96.8°F | Resp 11 | Ht 64.0 in | Wt 183.0 lb

## 2014-08-19 DIAGNOSIS — Z86010 Personal history of colonic polyps: Secondary | ICD-10-CM

## 2014-08-19 MED ORDER — SODIUM CHLORIDE 0.9 % IV SOLN: 500.0000 mL | INTRAVENOUS | Status: DC

## 2014-08-19 NOTE — Progress Notes (Signed)
Patient awakening,vss,report to rn 

## 2014-08-19 NOTE — Op Note (Signed)
[facility] [address] [place], [id number]   COLONOSCOPY PROCEDURE REPORT  PATIENT: Donald AngelicaWolfe, [name]  MR#: [id number] BIRTHDATE: November 27, 1960 , 53  yrs. old GENDER: male ENDOSCOPIST: Roxy CedarJohn N Rhanda Lemire Jr, MD REFERRED WG:[id number]:Surveillance Program Recall PROCEDURE DATE:  08/19/2014 PROCEDURE:   Colonoscopy, surveillance First Screening Colonoscopy - Avg.  risk and is 50 yrs.  old or older - No.  Prior Negative Screening - Now for repeat screening. N/A  History of Adenoma - Now for follow-up colonoscopy & has been > or = to 3 yrs.  Yes hx of adenoma.  Has been 3 or more years since last colonoscopy. ASA CLASS:   Class II INDICATIONS:Surveillance due to prior colonic neoplasia and PH Sessile serrated polyp(s) < 10 mm with no dysplasia.. Previous examination March 2011 with sessile serrated polyp without dysplasia MEDICATIONS: Monitored anesthesia care and Propofol 250 mg IV  DESCRIPTION OF PROCEDURE:   After the risks benefits and alternatives of the procedure were thoroughly explained, informed consent was obtained.  The digital rectal exam revealed no abnormalities of the rectum.   The LB [id number]  endoscope was introduced through the anus and advanced to the cecum, which was identified by both the appendix and ileocecal valve. No adverse events experienced.   The quality of the prep was excellent. (MoviPrep was used)  The instrument was then slowly withdrawn as the colon was fully examined.      COLON FINDINGS: A normal appearing cecum, ileocecal valve, and appendiceal orifice were identified.  The ascending, transverse, descending, sigmoid colon, and rectum appeared unremarkable. Retroflexed views revealed internal hemorrhoids. The time to cecum = 1.6 Withdrawal time = 8.3   The scope was withdrawn and the procedure completed.  COMPLICATIONS: There were no immediate complications.  ENDOSCOPIC IMPRESSION: Normal colonoscopy  RECOMMENDATIONS: Continue  current colorectal surveillance recommendations with a repeat colonoscopy in 10 years.  eSigned:  Roxy CedarJohn N Shilo Philipson Jr, MD 08/19/2014 9:57 AM   cc: Crawford GivensGraham Duncan, MD and The Patient

## 2014-08-19 NOTE — Patient Instructions (Signed)
Impressions/recommendations:  Normal colonoscopy  Repeat colonoscopy in 10 years.  YOU HAD AN ENDOSCOPIC PROCEDURE TODAY AT THE  ENDOSCOPY CENTER:   Refer to the procedure report that was given to you for any specific questions about what was found during the examination.  If the procedure report does not answer your questions, please call your gastroenterologist to clarify.  If you requested that your care partner not be given the details of your procedure findings, then the procedure report has been included in a sealed envelope for you to review at your convenience later.  YOU SHOULD EXPECT: Some feelings of bloating in the abdomen. Passage of more gas than usual.  Walking can help get rid of the air that was put into your GI tract during the procedure and reduce the bloating. If you had a lower endoscopy (such as a colonoscopy or flexible sigmoidoscopy) you may notice spotting of blood in your stool or on the toilet paper. If you underwent a bowel prep for your procedure, you may not have a normal bowel movement for a few days.  Please Note:  You might notice some irritation and congestion in your nose or some drainage.  This is from the oxygen used during your procedure.  There is no need for concern and it should clear up in a day or so.  SYMPTOMS TO REPORT IMMEDIATELY:   Following lower endoscopy (colonoscopy or flexible sigmoidoscopy):  Excessive amounts of blood in the stool  Significant tenderness or worsening of abdominal pains  Swelling of the abdomen that is new, acute  Fever of 100F or higher  For urgent or emergent issues, a gastroenterologist can be reached at any hour by calling [phone].   DIET: Your first meal following the procedure should be a small meal and then it is ok to progress to your normal diet. Heavy or fried foods are harder to digest and may make you feel nauseous or bloated.  Likewise, meals heavy in dairy and vegetables can increase bloating.   Drink plenty of fluids but you should avoid alcoholic beverages for 24 hours.  ACTIVITY:  You should plan to take it easy for the rest of today and you should NOT DRIVE or use heavy machinery until tomorrow (because of the sedation medicines used during the test).    FOLLOW UP: Our staff will call the number listed on your records the next business day following your procedure to check on you and address any questions or concerns that you may have regarding the information given to you following your procedure. If we do not reach you, we will leave a message.  However, if you are feeling well and you are not experiencing any problems, there is no need to return our call.  We will assume that you have returned to your regular daily activities without incident.  If any biopsies were taken you will be contacted by phone or by letter within the next 1-3 weeks.  Please call us at [phone] if you have not heard about the biopsies in 3 weeks.    SIGNATURES/CONFIDENTIALITY: You and/or your care partner have signed paperwork which will be entered into your electronic medical record.  These signatures attest to the fact that that the information above on your After Visit Summary has been reviewed and is understood.  Full responsibility of the confidentiality of this discharge information lies with you and/or your care-partner. 

## 2014-08-20 ENCOUNTER — Telehealth: Payer: Self-pay | Admitting: *Deleted

## 2014-08-20 NOTE — Telephone Encounter (Signed)
  Follow up Call-  Call back number 08/19/2014  Post procedure Call Back phone  # [phone]  Permission to leave phone message Yes     Patient questions:  Do you have a fever, pain , or abdominal swelling? No. Pain Score  0 *  Have you tolerated food without any problems? Yes.    Have you been able to return to your normal activities? Yes.    Do you have any questions about your discharge instructions: Diet   No. Medications  No. Follow up visit  No.  Do you have questions or concerns about your Care? No.  Actions: * If pain score is 4 or above: No action needed, pain <4.

## 2015-03-09 IMAGING — CR DG CHEST 2V
2 series · 2 of 2 positions shown · non-contrast
Comparison: None.

CLINICAL DATA: Bradycardia

CHEST - 2 VIEW

[w chest pa]
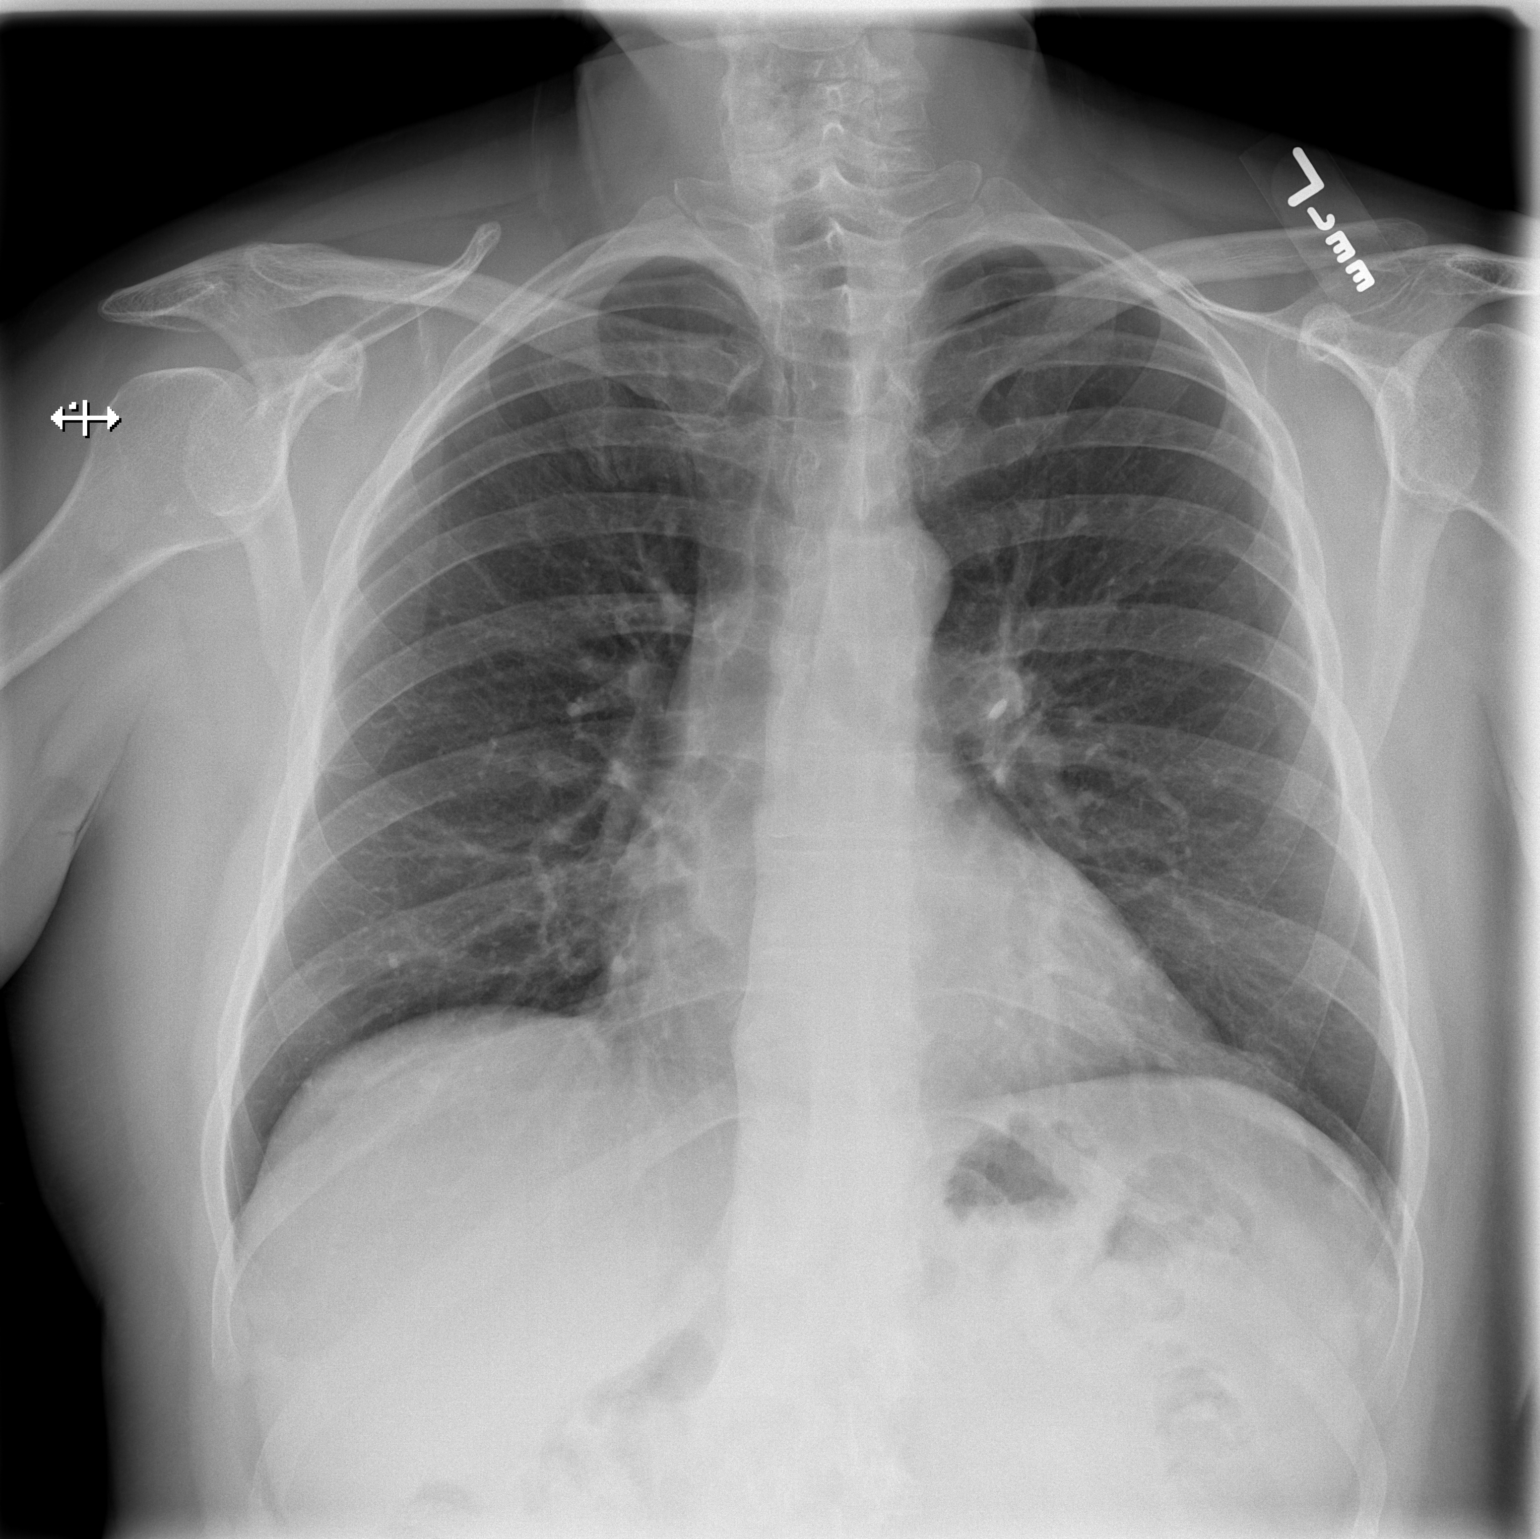

[w chest lat]
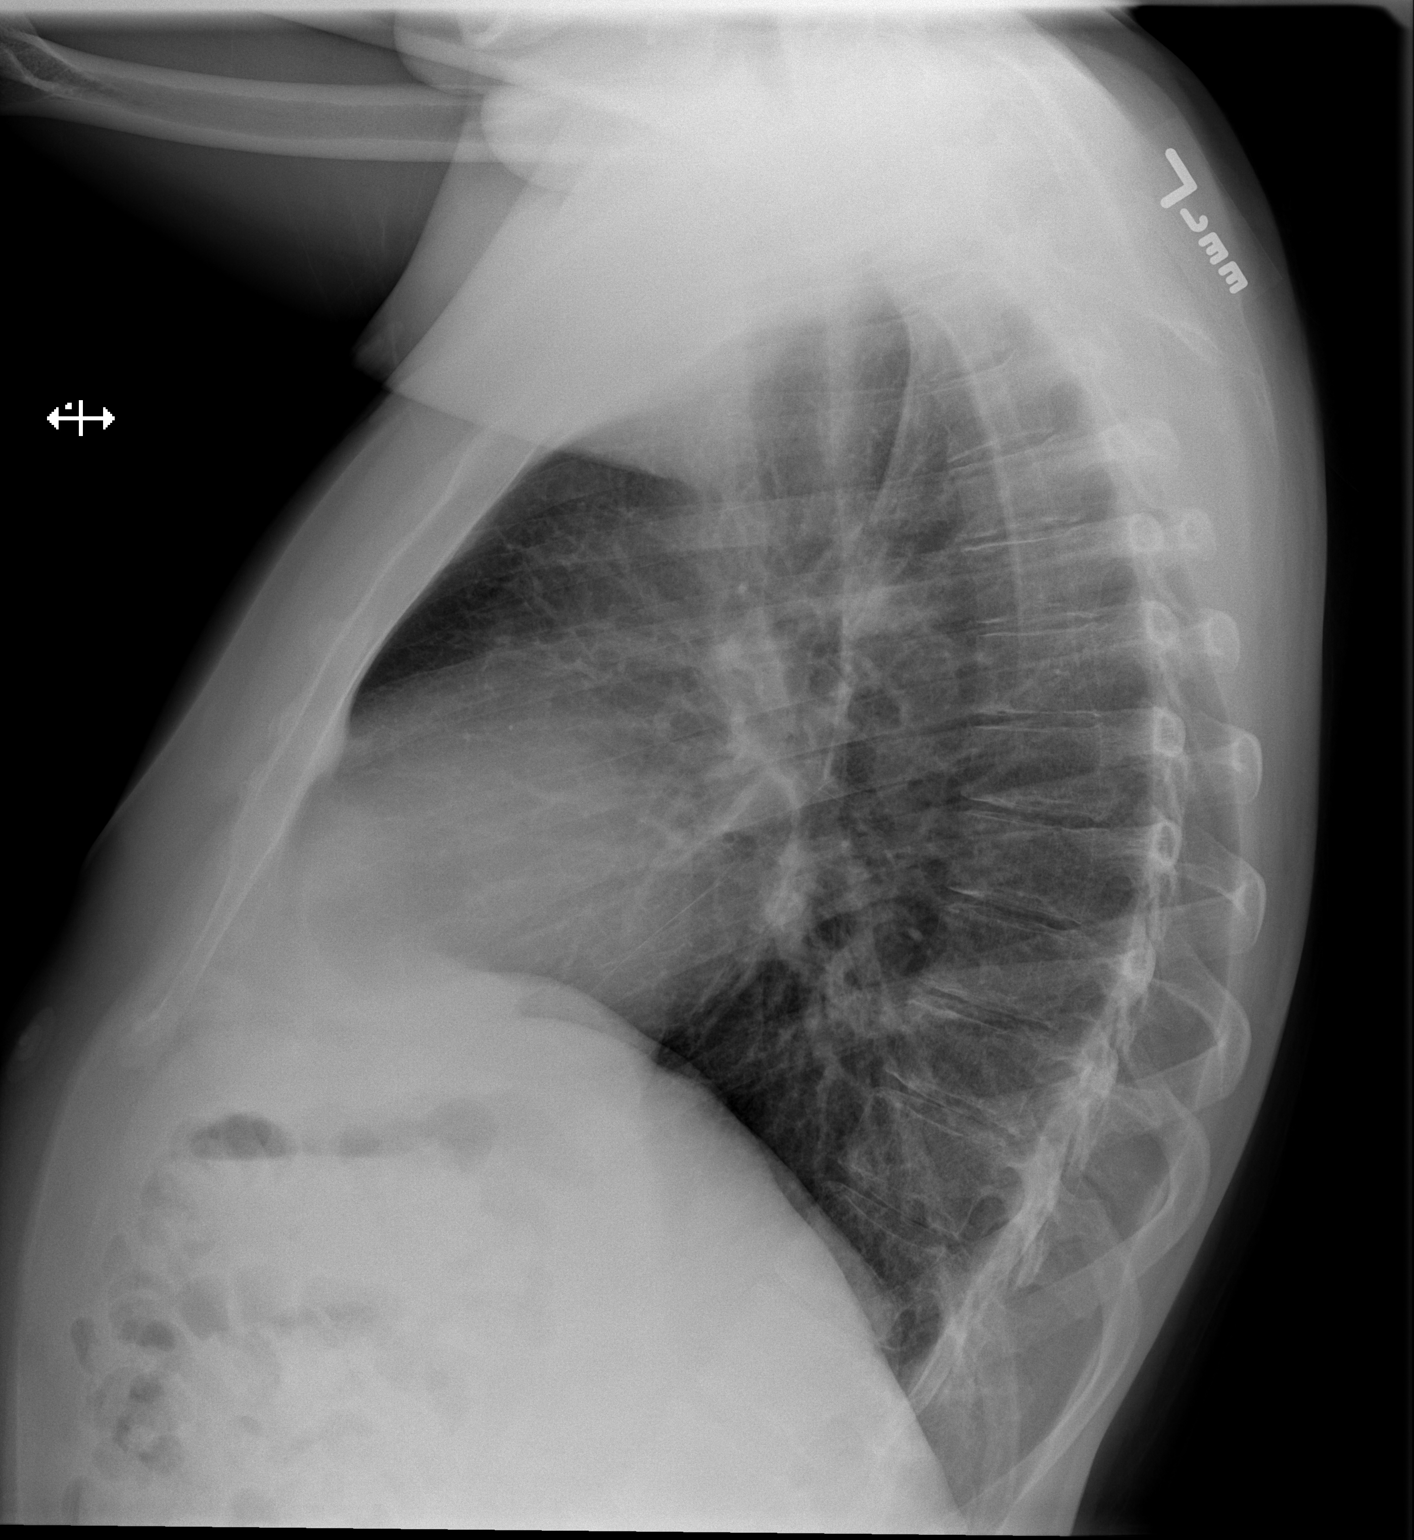

[2 of 2 positions shown; findings below may reference images not displayed]

FINDINGS: Lungs clear.  Heart size and pulmonary vascularity are
normal.  No adenopathy.  No bone lesions.
IMPRESSION: No abnormality noted.

## 2015-03-29 ENCOUNTER — Other Ambulatory Visit: Payer: Self-pay | Admitting: Family Medicine

## 2015-03-29 DIAGNOSIS — I10 Essential (primary) hypertension: Secondary | ICD-10-CM

## 2015-03-31 ENCOUNTER — Other Ambulatory Visit (INDEPENDENT_AMBULATORY_CARE_PROVIDER_SITE_OTHER): Payer: BLUE CROSS/BLUE SHIELD

## 2015-03-31 DIAGNOSIS — I10 Essential (primary) hypertension: Secondary | ICD-10-CM

## 2015-03-31 LAB — COMPREHENSIVE METABOLIC PANEL
ALBUMIN: 3.7 g/dL (ref 3.5–5.2)
ALK PHOS: 70 U/L (ref 39–117)
ALT: 21 U/L (ref 0–53)
AST: 16 U/L (ref 0–37)
BILIRUBIN TOTAL: 0.5 mg/dL (ref 0.2–1.2)
BUN: 15 mg/dL (ref 6–23)
CALCIUM: 8.9 mg/dL (ref 8.4–10.5)
CO2: 29 meq/L (ref 19–32)
Chloride: 107 mEq/L (ref 96–112)
Creatinine, Ser: 0.9 mg/dL (ref 0.40–1.50)
GFR: 92.71 mL/min (ref >60.00)
Glucose, Bld: 93 mg/dL (ref 70–99)
Potassium: 4.4 mEq/L (ref 3.5–5.1)
Sodium: 143 mEq/L (ref 135–145)
TOTAL PROTEIN: 6.2 g/dL (ref 6.0–8.3)

## 2015-03-31 LAB — LIPID PANEL
Cholesterol: 205 mg/dL — ABNORMAL HIGH (ref 0–200)
HDL: 53.9 mg/dL (ref >39.00)
LDL Cholesterol: 118 mg/dL — ABNORMAL HIGH (ref 0–99)
NonHDL: 150.81
Total CHOL/HDL Ratio: 4
Triglycerides: 162 mg/dL — ABNORMAL HIGH (ref 0.0–149.0)
VLDL: 32.4 mg/dL (ref 0.0–40.0)

## 2015-04-07 ENCOUNTER — Encounter: Payer: Self-pay | Admitting: Family Medicine

## 2015-04-07 ENCOUNTER — Ambulatory Visit (INDEPENDENT_AMBULATORY_CARE_PROVIDER_SITE_OTHER): Payer: BLUE CROSS/BLUE SHIELD | Admitting: Family Medicine

## 2015-04-07 VITALS — BP 122/74 | HR 63 | Temp 99.5°F | Ht 64.0 in | Wt 181.5 lb

## 2015-04-07 DIAGNOSIS — E7800 Pure hypercholesterolemia, unspecified: Secondary | ICD-10-CM | POA: Diagnosis not present

## 2015-04-07 DIAGNOSIS — Z23 Encounter for immunization: Secondary | ICD-10-CM

## 2015-04-07 DIAGNOSIS — I10 Essential (primary) hypertension: Secondary | ICD-10-CM | POA: Diagnosis not present

## 2015-04-07 DIAGNOSIS — Z0000 Encounter for general adult medical examination without abnormal findings: Secondary | ICD-10-CM

## 2015-04-07 DIAGNOSIS — Z119 Encounter for screening for infectious and parasitic diseases, unspecified: Secondary | ICD-10-CM

## 2015-04-07 MED ORDER — LISINOPRIL 20 MG PO TABS: ORAL_TABLET | ORAL | Status: DC

## 2015-04-07 MED ORDER — CLOTRIMAZOLE-BETAMETHASONE 1-0.05 % EX CREA: 1.0000 "application " | TOPICAL_CREAM | Freq: Two times a day (BID) | CUTANEOUS | Status: DC | PRN

## 2015-04-07 MED ORDER — HYDROCHLOROTHIAZIDE 12.5 MG PO CAPS: 12.5000 mg | ORAL_CAPSULE | Freq: Every day | ORAL | Status: DC

## 2015-04-07 NOTE — Progress Notes (Signed)
Pre visit review using our clinic review tool, if applicable. No additional management support is needed unless otherwise documented below in the visit note.  CPE- See plan.  Routine anticipatory guidance given to patient.  See health maintenance. Tetanus 2006 Flu shot done at work.  PNA and shingles shot not due, d/w pt.  Colonoscopy 2016 Prostate cancer screening and PSA options (with potential risks and benefits of testing vs not testing) were discussed along with recent recs/guidelines. He declined testing PSA at this point.  Living will d/w pt. Wife would be designated if incapacitated.  Diet d/w pt. Encouraged.   Exercise- walking prev, back to swimming.  H/o shoulder surgery but he is back swimming and doing well.  Pt opts in for HCV and HIV screening with the next set of labs.  D/w pt re: routine screening.    Mild URI sx.  Some aches.  Taking advil recently.  No aches today.  Not much rhinorrhea.  Possible sick contacts.  Some mild cough.   Hypertension:  Using medication without problems or lightheadedness: yes Chest pain with exertion:no Edema:no Short of breath:no No syncope. No skipped beats noted by patient.  Labs d/w pt. Minimal elevation in lipids, sugar and Cr wnl.   H/o jock itch, improved with lotrisone.  Needed refill.  No ADE on med.    PMH and SH reviewed  Meds, vitals, and allergies reviewed.   ROS: See HPI.  Otherwise negative.    GEN: nad, alert and oriented HEENT: mucous membranes moist, tm w/o erythema, nasal exam w/o erythema, clear discharge noted,  OP with minimal cobblestoning NECK: supple w/o LA CV: rrr.   PULM: ctab, no inc wob EXT: no edema SKIN: no acute rash

## 2015-04-07 NOTE — Patient Instructions (Signed)
Take care.  Glad to see you.  Keep exercising (when the cold is better). Drink plenty of fluids, take ibuprofen with food as needed, and gargle with warm salt water for your throat if needed.  This should gradually improve.   You can take plain claritin 10mg  a day if needed for runny nose and plain guaifenesin if needed for cough.  Take care.  Let us know if you have other concerns.

## 2015-04-08 NOTE — Assessment & Plan Note (Addendum)
Tetanus 2006 Flu shot done at work.  PNA and shingles shot not due, d/w pt.  Colonoscopy 2016 Prostate cancer screening and PSA options (with potential risks and benefits of testing vs not testing) were discussed along with recent recs/guidelines. He declined testing PSA at this point.  Living will d/w pt. Wife would be designated if incapacitated.  Diet d/w pt. Encouraged.   Exercise- walking prev, back to swimming.  H/o shoulder surgery but he is back swimming and doing well.  Pt opts in for HCV and HIV screening with the next set of labs.  D/w pt re: routine screening.   Mild URI sx should resolve.  F/u prn.  He agrees.

## 2015-04-08 NOTE — Assessment & Plan Note (Signed)
Controlled, work on diet and exercise for lipids.  D/w pt.  He agrees.

## 2015-04-27 ENCOUNTER — Telehealth: Payer: Self-pay | Admitting: Family Medicine

## 2015-04-27 MED ORDER — AZITHROMYCIN 250 MG PO TABS
ORAL_TABLET | ORAL | Status: DC
Start: 2015-04-27 — End: 2015-10-27

## 2015-04-27 NOTE — Telephone Encounter (Signed)
Pt called stating he has had a cough with no fever for about 3 weeks.  He stated he is coughing up phelm yellow.  He has been taking musenix.  This helps him cough up stuff.  He cannot get rid of cough He wanted to know what else he can do.  He is able to sleep at night

## 2015-04-27 NOTE — Telephone Encounter (Signed)
Patient advised.

## 2015-04-27 NOTE — Telephone Encounter (Signed)
Sent zmax.   If not better with med, or if he wants to come in for eval, then schedule. Have him update us in a few days.  Thanks.

## 2015-04-30 ENCOUNTER — Telehealth: Payer: Self-pay | Admitting: Family Medicine

## 2015-04-30 NOTE — Telephone Encounter (Signed)
Noted. Thanks.  I would expect this to continue to gradually improve.  If he gets worse, then needs OV.  Thanks.

## 2015-04-30 NOTE — Telephone Encounter (Signed)
Patient advised.

## 2015-04-30 NOTE — Telephone Encounter (Signed)
Pt wanted to let you know  How he is feeling he is doing better. Still coughing some but not as bad.  Still coughing stuff up not as much white color for most part in am its darker

## 2015-10-27 ENCOUNTER — Encounter: Payer: Self-pay | Admitting: Family Medicine

## 2015-10-27 ENCOUNTER — Ambulatory Visit (INDEPENDENT_AMBULATORY_CARE_PROVIDER_SITE_OTHER): Payer: BLUE CROSS/BLUE SHIELD | Admitting: Family Medicine

## 2015-10-27 VITALS — BP 122/66 | HR 66 | Temp 98.7°F | Wt 185.5 lb

## 2015-10-27 DIAGNOSIS — J3489 Other specified disorders of nose and nasal sinuses: Secondary | ICD-10-CM

## 2015-10-27 MED ORDER — CETIRIZINE HCL 10 MG PO TABS: 10.0000 mg | ORAL_TABLET | Freq: Every day | ORAL | Status: DC

## 2015-10-27 MED ORDER — FLUTICASONE PROPIONATE 50 MCG/ACT NA SUSP: 2.0000 | Freq: Every day | NASAL | Status: DC

## 2015-10-27 MED ORDER — AMOXICILLIN-POT CLAVULANATE 875-125 MG PO TABS: 1.0000 | ORAL_TABLET | Freq: Two times a day (BID) | ORAL | Status: DC

## 2015-10-27 NOTE — Progress Notes (Signed)
Pre visit review using our clinic review tool, if applicable. No additional management support is needed unless otherwise documented below in the visit note.  Sx for the last 2 months.  Mainly clear nasal congestion.  Taking zyrtec and flonase, used intermittently.  He is leaving town in about 10 days.  Some cough and some sputum.  This AM with more sputum, darker today.  Today with some discolored rhinorrhea.  No FCNAVD.  No wheeze.  Some fatigue.   zyretc used for the last 2 days, hadn't used flonase in about 1 week.    Meds, vitals, and allergies reviewed.   ROS: Per HPI unless specifically indicated in ROS section   GEN: nad, alert and oriented HEENT: mucous membranes moist, tm w/o erythema, nasal exam w/o erythema, clear discharge noted,  OP with cobblestoning, sinuses not ttp NECK: supple w/o LA CV: rrr.   PULM: ctab, no inc wob EXT: no edema SKIN: no acute rash

## 2015-10-27 NOTE — Patient Instructions (Signed)
Use nasal saline the AM, followed by flonase.  If you still have a runny nose, then add on zyrtec daily as needed.  If more sinus pain, fever, or discolored discharge after the weekend, then start augmentin.  Take care.  Glad to see you.  Update me as needed.

## 2015-10-28 ENCOUNTER — Ambulatory Visit: Payer: BLUE CROSS/BLUE SHIELD | Admitting: Family Medicine

## 2015-10-28 ENCOUNTER — Telehealth: Payer: Self-pay

## 2015-10-28 DIAGNOSIS — J3489 Other specified disorders of nose and nasal sinuses: Secondary | ICD-10-CM | POA: Insufficient documentation

## 2015-10-28 MED ORDER — FLUTICASONE PROPIONATE 50 MCG/ACT NA SUSP: 2.0000 | Freq: Every day | NASAL | Status: DC

## 2015-10-28 NOTE — Telephone Encounter (Signed)
Since pt was not going to pick up abx pt cut the prescription in half and [facility] would not accept the altered rx. Spoke with [facility] and that was verified. Advised pt will send flonase electronically now to [facility] and if pt needs abx next wk pt will cb. Pt voiced understanding.

## 2015-10-28 NOTE — Assessment & Plan Note (Signed)
Use nasal saline the AM, followed by flonase.  If still with a runny nose, then add on zyrtec daily as needed.  If more sinus pain, fever, or discolored discharge after the weekend, then start augmentin.  Hold abx for now.  Nontoxic.  He agrees.  F/u prn.

## 2015-11-20 MED ORDER — AMOXICILLIN-POT CLAVULANATE 875-125 MG PO TABS: 1.0000 | ORAL_TABLET | Freq: Two times a day (BID) | ORAL | 0 refills | Status: DC

## 2015-11-20 NOTE — Telephone Encounter (Signed)
Sent.  F/u prn.  Thanks.

## 2015-11-20 NOTE — Addendum Note (Signed)
Addended by: Joaquim NamUNCAN, Kydan Shanholtzer S on: 11/20/2015 01:27 PM   Modules accepted: Orders

## 2015-11-20 NOTE — Addendum Note (Signed)
Addended by: Patience MuscaISLEY, Stepan Verrette M on: 11/20/2015 12:37 PM   Modules accepted: Orders

## 2015-11-20 NOTE — Telephone Encounter (Addendum)
Pt left v/m; pt was seen on 10/27/15; pt cut the prescription for Augmentin and [facility] would not accept the rx. [facility] and pt has not gotten Augmentin filled since 10/27/15. Per 10/27/15 office note if pt developed more sinus pain, fever or discolored phlegm then pt could start Augmentin while on a trip. Now pt has similar symptoms as when seen on 10/27/15 and wants augmentin rx sent to [facility]. Since has been more than 2 weeks is it OK to send augmentin to [facility]. Pt request cb.

## 2015-11-20 NOTE — Telephone Encounter (Signed)
Pt was notified and verbalized understanding.

## 2016-03-27 ENCOUNTER — Other Ambulatory Visit: Payer: Self-pay | Admitting: Family Medicine

## 2016-03-27 DIAGNOSIS — I10 Essential (primary) hypertension: Secondary | ICD-10-CM

## 2016-03-27 DIAGNOSIS — Z119 Encounter for screening for infectious and parasitic diseases, unspecified: Secondary | ICD-10-CM

## 2016-03-31 ENCOUNTER — Other Ambulatory Visit (INDEPENDENT_AMBULATORY_CARE_PROVIDER_SITE_OTHER): Payer: BLUE CROSS/BLUE SHIELD

## 2016-03-31 DIAGNOSIS — I10 Essential (primary) hypertension: Secondary | ICD-10-CM

## 2016-03-31 DIAGNOSIS — Z119 Encounter for screening for infectious and parasitic diseases, unspecified: Secondary | ICD-10-CM

## 2016-03-31 LAB — COMPREHENSIVE METABOLIC PANEL
ALBUMIN: 4.1 g/dL (ref 3.5–5.2)
ALK PHOS: 66 U/L (ref 39–117)
ALT: 18 U/L (ref 0–53)
AST: 17 U/L (ref 0–37)
BILIRUBIN TOTAL: 0.5 mg/dL (ref 0.2–1.2)
BUN: 17 mg/dL (ref 6–23)
CALCIUM: 9 mg/dL (ref 8.4–10.5)
CHLORIDE: 104 meq/L (ref 96–112)
CO2: 33 mEq/L — ABNORMAL HIGH (ref 19–32)
CREATININE: 1.07 mg/dL (ref 0.40–1.50)
GFR: 75.66 mL/min (ref >60.00)
Glucose, Bld: 100 mg/dL — ABNORMAL HIGH (ref 70–99)
Potassium: 4.1 mEq/L (ref 3.5–5.1)
SODIUM: 142 meq/L (ref 135–145)
TOTAL PROTEIN: 6.8 g/dL (ref 6.0–8.3)

## 2016-03-31 LAB — LIPID PANEL
CHOLESTEROL: 221 mg/dL — AB (ref 0–200)
HDL: 60.2 mg/dL (ref >39.00)
LDL CALC: 124 mg/dL — AB (ref 0–99)
NonHDL: 160.56
TRIGLYCERIDES: 185 mg/dL — AB (ref 0.0–149.0)
Total CHOL/HDL Ratio: 4
VLDL: 37 mg/dL (ref 0.0–40.0)

## 2016-04-01 LAB — HIV ANTIBODY (ROUTINE TESTING W REFLEX): HIV 1&2 Ab, 4th Generation: NONREACTIVE

## 2016-04-01 LAB — HEPATITIS C ANTIBODY: HCV Ab: NEGATIVE

## 2016-04-07 ENCOUNTER — Encounter: Payer: Self-pay | Admitting: Family Medicine

## 2016-04-07 ENCOUNTER — Ambulatory Visit (INDEPENDENT_AMBULATORY_CARE_PROVIDER_SITE_OTHER): Payer: BLUE CROSS/BLUE SHIELD | Admitting: Family Medicine

## 2016-04-07 VITALS — BP 142/84 | HR 58 | Temp 98.5°F | Ht 64.0 in | Wt 182.2 lb

## 2016-04-07 DIAGNOSIS — Z0000 Encounter for general adult medical examination without abnormal findings: Secondary | ICD-10-CM

## 2016-04-07 DIAGNOSIS — I10 Essential (primary) hypertension: Secondary | ICD-10-CM

## 2016-04-07 MED ORDER — HYDROCHLOROTHIAZIDE 12.5 MG PO CAPS: 12.5000 mg | ORAL_CAPSULE | Freq: Every day | ORAL | 3 refills | Status: DC

## 2016-04-07 MED ORDER — LISINOPRIL 20 MG PO TABS: ORAL_TABLET | ORAL | 3 refills | Status: DC

## 2016-04-07 NOTE — Assessment & Plan Note (Signed)
Tetanus 2016 Flu shot done at [facility].  PNA and shingles shot not due, d/w pt. Colonoscopy 2016 Prostate cancer screening and PSA options (with potential risks and benefits of testing vs not testing) were discussed along with recent recs/guidelines. He declined testing PSA at this point.  Living will d/w pt. Wife would be designated if incapacitated.  Diet d/w pt. Encouraged.   Exercise- walking prev, back to swimming.  H/o shoulder surgery but he is back swimming and doing well.  HCV and HIV labs d/w pt

## 2016-04-07 NOTE — Progress Notes (Signed)
CPE- See plan.  Routine anticipatory guidance given to patient.  See health maintenance. Tetanus 2016 Flu shot [facility]  PNA and shingles shot not due, d/w pt. Colonoscopy 2016 Prostate cancer screening and PSA options (with potential risks and benefits of testing vs not testing) were discussed along with recent recs/guidelines. He declined testing PSA at this point.  Living will d/w pt. Wife would be designated if incapacitated.  Diet d/w pt. Encouraged.   Exercise- walking prev, back to swimming.  H/o shoulder surgery but he is back swimming and doing well.  HCV and HIV labs d/w pt   Hypertension:    Using medication without problems or lightheadedness: yes Chest pain with exertion:no Edema:no Short of breath:no Labs d/w pt.    PMH and SH reviewed  Meds, vitals, and allergies reviewed.   ROS: Per HPI.  Unless specifically indicated otherwise in HPI, the patient denies:  General: fever. Eyes: acute vision changes ENT: sore throat Cardiovascular: chest pain Respiratory: SOB GI: vomiting GU: dysuria Musculoskeletal: acute back pain Derm: acute rash Neuro: acute motor dysfunction Psych: worsening mood Endocrine: polydipsia Heme: bleeding Allergy: hayfever  GEN: nad, alert and oriented HEENT: mucous membranes moist NECK: supple w/o LA CV: rrr. PULM: ctab, no inc wob ABD: soft, +bs EXT: no edema

## 2016-04-07 NOTE — Progress Notes (Signed)
Pre visit review using our clinic review tool, if applicable. No additional management support is needed unless otherwise documented below in the visit note. 

## 2016-04-07 NOTE — Patient Instructions (Addendum)
Take care.  Glad to see you.  Don't change your meds for now.  Update me as needed.   

## 2016-04-08 NOTE — Assessment & Plan Note (Signed)
Reasonable control. Continue current meds. Continue work on diet and exercise. He agrees.

## 2016-10-11 ENCOUNTER — Ambulatory Visit (INDEPENDENT_AMBULATORY_CARE_PROVIDER_SITE_OTHER): Payer: BLUE CROSS/BLUE SHIELD | Admitting: Internal Medicine

## 2016-10-11 ENCOUNTER — Encounter: Payer: Self-pay | Admitting: Internal Medicine

## 2016-10-11 VITALS — BP 120/80 | HR 62 | Temp 98.4°F | Wt 182.8 lb

## 2016-10-11 DIAGNOSIS — W57XXXA Bitten or stung by nonvenomous insect and other nonvenomous arthropods, initial encounter: Secondary | ICD-10-CM | POA: Diagnosis not present

## 2016-10-11 DIAGNOSIS — R21 Rash and other nonspecific skin eruption: Secondary | ICD-10-CM | POA: Diagnosis not present

## 2016-10-11 DIAGNOSIS — I10 Essential (primary) hypertension: Secondary | ICD-10-CM | POA: Diagnosis not present

## 2016-10-11 DIAGNOSIS — S30861A Insect bite (nonvenomous) of abdominal wall, initial encounter: Secondary | ICD-10-CM | POA: Diagnosis not present

## 2016-10-11 NOTE — Patient Instructions (Signed)
Call or return to clinic prn if these symptoms worsen or fail to improve as anticipated.

## 2016-10-11 NOTE — Progress Notes (Signed)
Subjective:    Patient ID: Donnelly AngelicaKevin B Mosely, male    DOB: 07/25/1960, 56 y.o.   MRN: [id number]  HPI 56 year old patient who has a history of essential hypertension.  Yesterday he noted the onset of some mild discomfort and irritation involving the mid back area.  He felt that he may have been stung by an insect Yesterday, his wife noted a patchy area of erythema and the patient was concerned about possible shingles Over the past 24 hours.  There has been no change in size or discomfort No constitutional symptoms.  The patient describes some mild local discomfort only  Past Medical History:  Diagnosis Date  . Bradycardia   . Heart murmur    unremarkable echo 2006  . Hypertension   . IBS (irritable bowel syndrome)    controlled with fiber supplement- diarrhea predominant     Social History   Social History  . Marital status: Married    Spouse name: N/A  . Number of children: 1  . Years of education: N/A   Occupational History  . COMPUTER PROGRAMMER [employer]   Social History Main Topics  . Smoking status: Never Smoker  . Smokeless tobacco: Never Used  . Alcohol use No  . Drug use: No  . Sexual activity: Yes   Other Topics Concern  . 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 APPENDECTOMY  56 years old  . SHOULDER SURGERY Left 2014    Family History  Problem Relation Age of Onset  . Kidney disease Mother   . Hypertension Mother        secondary  . Hyperlipidemia Mother   . Heart disease Father        MI X 2, 58/60, PTCA  . Hypertension Father   . Obesity Sister   . Hypertension Brother   . Lung disease Brother        past smoker  . Heart disease Maternal Grandmother        MI  . Heart disease Maternal Grandfather        MI  . Hypertension Brother   .  Cancer Paternal Grandfather        prostate  . Colon cancer Neg Hx   . Prostate cancer Neg Hx     No Known Allergies  Current Outpatient Prescriptions on File Prior to Visit  Medication Sig Dispense Refill  . aspirin 81 MG tablet Take 1 tablet by mouth every other day.    . clotrimazole-betamethasone (LOTRISONE) cream Apply 1 application topically 2 (two) times daily as needed. 15 g 1  . FIBER PO Take by mouth. Equate Fiber-Take 1 tablespoon daily    . Flaxseed, Linseed, (FLAX SEED OIL) 1000 MG CAPS Take 1 capsule by mouth daily. Flaxseed 1300 mg daily    . Garlic Oil 1000 MG CAPS Take 1,000 mg by mouth daily.     . hydrochlorothiazide (MICROZIDE) 12.5 MG capsule Take 1 capsule (12.5 mg total) by mouth daily. 90 capsule 3  . lisinopril (PRINIVIL,ZESTRIL) 20 MG tablet TAKE ONE TABLET BY MOUTH ONCE DAILY. 90 tablet 3  . Multiple Vitamin (MULTIVITAMIN PO) Take by mouth daily.      . vitamin C (ASCORBIC ACID) 500 MG tablet Take 1,000 mg by mouth  daily.      No current facility-administered medications on file prior to visit.     BP 120/80 (BP Location: Left Arm, Patient Position: Sitting, Cuff Size: Normal)   Pulse 62   Temp 98.4 F (36.9 C) (Oral)   Wt 182 lb 12.8 oz (82.9 kg)   SpO2 97%   BMI 31.38 kg/m        Review of Systems  Constitutional: Negative.   Skin: Positive for rash.       Objective:   Physical Exam  Constitutional: He appears well-developed and well-nourished. He appears distressed.  Skin:  5 cm area of patchy erythema involving the mid back area in the mid thoracic area.  The erythema blanches with pressure.  No puncture wound identified          Assessment & Plan:   Probable insect bite with some local inflammatory reaction.  No cellulitis.  Local wound care discussed.  He will try a anti-histamine ibuprofen and cold compresses.  He will call there is any clinical worsening  Rogelia Boga

## 2017-04-12 ENCOUNTER — Other Ambulatory Visit (INDEPENDENT_AMBULATORY_CARE_PROVIDER_SITE_OTHER): Payer: BLUE CROSS/BLUE SHIELD

## 2017-04-12 ENCOUNTER — Other Ambulatory Visit: Payer: Self-pay | Admitting: Family Medicine

## 2017-04-12 DIAGNOSIS — I10 Essential (primary) hypertension: Secondary | ICD-10-CM | POA: Diagnosis not present

## 2017-04-12 LAB — COMPREHENSIVE METABOLIC PANEL
ALK PHOS: 75 U/L (ref 39–117)
ALT: 21 U/L (ref 0–53)
AST: 17 U/L (ref 0–37)
Albumin: 3.9 g/dL (ref 3.5–5.2)
BUN: 17 mg/dL (ref 6–23)
CHLORIDE: 102 meq/L (ref 96–112)
CO2: 31 mEq/L (ref 19–32)
Calcium: 8.8 mg/dL (ref 8.4–10.5)
Creatinine, Ser: 0.98 mg/dL (ref 0.40–1.50)
GFR: 83.43 mL/min (ref >60.00)
Glucose, Bld: 91 mg/dL (ref 70–99)
POTASSIUM: 4.1 meq/L (ref 3.5–5.1)
SODIUM: 142 meq/L (ref 135–145)
TOTAL PROTEIN: 6.2 g/dL (ref 6.0–8.3)
Total Bilirubin: 0.5 mg/dL (ref 0.2–1.2)

## 2017-04-12 LAB — LIPID PANEL
CHOL/HDL RATIO: 4
Cholesterol: 222 mg/dL — ABNORMAL HIGH (ref 0–200)
HDL: 50.2 mg/dL (ref >39.00)
NonHDL: 172.11
TRIGLYCERIDES: 231 mg/dL — AB (ref 0.0–149.0)
VLDL: 46.2 mg/dL — AB (ref 0.0–40.0)

## 2017-04-12 LAB — LDL CHOLESTEROL, DIRECT: Direct LDL: 162 mg/dL

## 2017-04-17 ENCOUNTER — Encounter: Payer: Self-pay | Admitting: Family Medicine

## 2017-04-17 ENCOUNTER — Ambulatory Visit (INDEPENDENT_AMBULATORY_CARE_PROVIDER_SITE_OTHER): Payer: BLUE CROSS/BLUE SHIELD | Admitting: Family Medicine

## 2017-04-17 VITALS — BP 124/66 | HR 61 | Temp 98.6°F | Ht 64.0 in | Wt 182.5 lb

## 2017-04-17 DIAGNOSIS — I10 Essential (primary) hypertension: Secondary | ICD-10-CM

## 2017-04-17 DIAGNOSIS — Z7189 Other specified counseling: Secondary | ICD-10-CM

## 2017-04-17 DIAGNOSIS — Z0000 Encounter for general adult medical examination without abnormal findings: Secondary | ICD-10-CM | POA: Diagnosis not present

## 2017-04-17 MED ORDER — LISINOPRIL 20 MG PO TABS: ORAL_TABLET | ORAL | 3 refills | Status: DC

## 2017-04-17 MED ORDER — HYDROCHLOROTHIAZIDE 12.5 MG PO CAPS: 12.5000 mg | ORAL_CAPSULE | Freq: Every day | ORAL | 3 refills | Status: DC

## 2017-04-17 NOTE — Patient Instructions (Signed)
Don't change your meds.  Cut some carbs at breakfast.  Keep exercising.  Recheck in about 1 year.  Update me as needed.  Take care.  Glad to see you.

## 2017-04-17 NOTE — Progress Notes (Signed)
CPE- See plan.  Routine anticipatory guidance given to patient.  See health maintenance.  The possibility exists that previously documented standard health maintenance information may have been brought forward from a previous encounter into this note.  If needed, that same information has been updated to reflect the current situation based on today's encounter.    Tetanus 2016 Flu shot done prev.  PNA shot not due, d/w pt. Shingles d/w pt.  Reasonable to do at 58.   Colonoscopy 2016 Prostate cancer screening and PSA options (with potential risks and benefits of testing vs not testing) were discussed along with recent recs/guidelines. He declined testing PSA at this point. Living will d/w pt. Wife would be designated if incapacitated.  Diet d/w pt.   Exercise- walking prev, back to swimming. H/o shoulder surgery but he is back swimming and doing well.  HCV and HIV prev neg.     Hypertension:    Using medication without problems or lightheadedness: yes Chest pain with exertion:no Edema:no Short of breath:no He is swimming about 1 hour at a time, 3 times per week.   Lipids d/w pt.  Risk ~9%.  He wants to work more and diet and weight and recheck at next CPE.  This is reasonable.    PMH and SH reviewed  Meds, vitals, and allergies reviewed.   ROS: Per HPI.  Unless specifically indicated otherwise in HPI, the patient denies:  General: fever. Eyes: acute vision changes ENT: sore throat Cardiovascular: chest pain Respiratory: SOB GI: vomiting GU: dysuria Musculoskeletal: acute back pain Derm: acute rash Neuro: acute motor dysfunction Psych: worsening mood Endocrine: polydipsia Heme: bleeding Allergy: hayfever  GEN: nad, alert and oriented HEENT: mucous membranes moist NECK: supple w/o LA CV: rrr. PULM: ctab, no inc wob ABD: soft, +bs EXT: no edema SKIN: no acute rash

## 2017-04-17 NOTE — Assessment & Plan Note (Signed)
Controlled BP, lipids elevated. Lipids d/w pt.  ASCVD risk ~9%.  He wants to work more and diet and weight and recheck at next CPE.  This is reasonable.  Update me as needed. Continue exercise.

## 2017-04-17 NOTE — Assessment & Plan Note (Signed)
Tetanus 2016 Flu shot done prev.  PNA shot not due, d/w pt. Shingles d/w pt.  Reasonable to do at 58.   Colonoscopy 2016 Prostate cancer screening and PSA options (with potential risks and benefits of testing vs not testing) were discussed along with recent recs/guidelines. He declined testing PSA at this point. Living will d/w pt. Wife would be designated if incapacitated.  Diet d/w pt.   Exercise- walking prev, back to swimming. H/o shoulder surgery but he is back swimming and doing well.  HCV and HIV prev neg.

## 2017-04-17 NOTE — Assessment & Plan Note (Signed)
Living will d/w pt. Wife would be designated if incapacitated.  

## 2018-01-26 DIAGNOSIS — Z23 Encounter for immunization: Secondary | ICD-10-CM | POA: Diagnosis not present

## 2018-04-08 ENCOUNTER — Other Ambulatory Visit: Payer: Self-pay | Admitting: Family Medicine

## 2018-04-08 DIAGNOSIS — E7800 Pure hypercholesterolemia, unspecified: Secondary | ICD-10-CM

## 2018-04-08 DIAGNOSIS — I10 Essential (primary) hypertension: Secondary | ICD-10-CM

## 2018-04-08 DIAGNOSIS — I499 Cardiac arrhythmia, unspecified: Secondary | ICD-10-CM

## 2018-04-12 ENCOUNTER — Other Ambulatory Visit (INDEPENDENT_AMBULATORY_CARE_PROVIDER_SITE_OTHER): Payer: BLUE CROSS/BLUE SHIELD

## 2018-04-12 DIAGNOSIS — E7800 Pure hypercholesterolemia, unspecified: Secondary | ICD-10-CM

## 2018-04-12 LAB — COMPREHENSIVE METABOLIC PANEL
ALK PHOS: 82 U/L (ref 39–117)
ALT: 17 U/L (ref 0–53)
AST: 15 U/L (ref 0–37)
Albumin: 3.9 g/dL (ref 3.5–5.2)
BILIRUBIN TOTAL: 0.4 mg/dL (ref 0.2–1.2)
BUN: 18 mg/dL (ref 6–23)
CO2: 31 mEq/L (ref 19–32)
Calcium: 8.8 mg/dL (ref 8.4–10.5)
Chloride: 104 mEq/L (ref 96–112)
Creatinine, Ser: 0.98 mg/dL (ref 0.40–1.50)
GFR: 83.14 mL/min (ref >60.00)
Glucose, Bld: 91 mg/dL (ref 70–99)
Potassium: 3.9 mEq/L (ref 3.5–5.1)
Sodium: 141 mEq/L (ref 135–145)
Total Protein: 6.5 g/dL (ref 6.0–8.3)

## 2018-04-12 LAB — LIPID PANEL
Cholesterol: 214 mg/dL — ABNORMAL HIGH (ref 0–200)
HDL: 55.1 mg/dL (ref >39.00)
NonHDL: 159.2
Total CHOL/HDL Ratio: 4
Triglycerides: 226 mg/dL — ABNORMAL HIGH (ref 0.0–149.0)
VLDL: 45.2 mg/dL — ABNORMAL HIGH (ref 0.0–40.0)

## 2018-04-12 LAB — LDL CHOLESTEROL, DIRECT: Direct LDL: 146 mg/dL

## 2018-04-12 LAB — TSH: TSH: 1.04 u[IU]/mL (ref 0.35–4.50)

## 2018-04-19 ENCOUNTER — Ambulatory Visit (INDEPENDENT_AMBULATORY_CARE_PROVIDER_SITE_OTHER): Payer: BLUE CROSS/BLUE SHIELD | Admitting: Family Medicine

## 2018-04-19 ENCOUNTER — Encounter: Payer: Self-pay | Admitting: Family Medicine

## 2018-04-19 VITALS — BP 126/80 | HR 51 | Temp 98.2°F | Ht 63.5 in | Wt 177.0 lb

## 2018-04-19 DIAGNOSIS — I10 Essential (primary) hypertension: Secondary | ICD-10-CM

## 2018-04-19 DIAGNOSIS — Z0000 Encounter for general adult medical examination without abnormal findings: Secondary | ICD-10-CM | POA: Diagnosis not present

## 2018-04-19 DIAGNOSIS — E7800 Pure hypercholesterolemia, unspecified: Secondary | ICD-10-CM

## 2018-04-19 DIAGNOSIS — Z7189 Other specified counseling: Secondary | ICD-10-CM

## 2018-04-19 MED ORDER — HYDROCHLOROTHIAZIDE 12.5 MG PO CAPS: 12.5000 mg | ORAL_CAPSULE | Freq: Every day | ORAL | 3 refills | Status: DC

## 2018-04-19 MED ORDER — LISINOPRIL 20 MG PO TABS: ORAL_TABLET | ORAL | 3 refills | Status: DC

## 2018-04-19 NOTE — Assessment & Plan Note (Signed)
Tetanus 2016 Flu shot done prev.  PNA shot not due, d/w pt. Shingles d/w pt.  Reasonable to do at 58.   Colonoscopy 2016 Prostate cancer screening and PSA options (with potential risks and benefits of testing vs not testing) were discussed along with recent recs/guidelines. He declined testing PSA at this point. Living will d/w pt. Wife would be designated if patient were incapacitated.  Diet d/w pt.   Exercise- swimming. H/o shoulder surgery but he is back swimming and doing well.  HCV and HIV prev neg.

## 2018-04-19 NOTE — Assessment & Plan Note (Signed)
  Living will d/w pt.  Wife would be designated if patient were incapacitated.   

## 2018-04-19 NOTE — Progress Notes (Signed)
CPE- See plan.  Routine anticipatory guidance given to patient.  See health maintenance.  The possibility exists that previously documented standard health maintenance information may have been brought forward from a previous encounter into this note.  If needed, that same information has been updated to reflect the current situation based on today's encounter.    Tetanus 2016 Flu shot done prev.  PNA shot not due, d/w pt. Shingles d/w pt.  Reasonable to do at 58.   Colonoscopy 2016 Prostate cancer screening and PSA options (with potential risks and benefits of testing vs not testing) were discussed along with recent recs/guidelines. He declined testing PSA at this point. Living will d/w pt. Wife would be designated if patient were incapacitated.  Diet d/w pt.   Exercise- swimming. H/o shoulder surgery but he is back swimming and doing well.  HCV and HIV prev neg.     Hypertension:    Using medication without problems or lightheadedness: yes Chest pain with exertion:no Edema:no Short of breath:no  HLD.  Lipids d/w pt.  Risk ~9%.    Offered statin trial.  Discussed rationale.  He wanted to continue work on diet and exercise.    He is still swimming 1 mile at a time, up to 3 times a week.    PMH and SH reviewed  Meds, vitals, and allergies reviewed.   ROS: Per HPI.  Unless specifically indicated otherwise in HPI, the patient denies:  General: fever. Eyes: acute vision changes ENT: sore throat Cardiovascular: chest pain Respiratory: SOB GI: vomiting GU: dysuria Musculoskeletal: acute back pain Derm: acute rash Neuro: acute motor dysfunction Psych: worsening mood Endocrine: polydipsia Heme: bleeding Allergy: hayfever  GEN: nad, alert and oriented HEENT: mucous membranes moist NECK: supple w/o LA CV: rrr. PULM: ctab, no inc wob ABD: soft, +bs EXT: no edema SKIN: no acute rash

## 2018-04-19 NOTE — Patient Instructions (Signed)
Thanks for your effort.  Keep working on diet and exercise.  Update me as needed.  Take care.  Glad to see you. 

## 2018-04-19 NOTE — Assessment & Plan Note (Signed)
Reasonable control.  Continue work on diet and exercise.  Labs discussed with patient.  Statin declined.

## 2018-04-19 NOTE — Assessment & Plan Note (Signed)
ASCVD score about 9%.  Discussed options Continue work on diet and exercise.  Labs discussed with patient.  Statin declined.  Rationale discussed.

## 2018-10-16 ENCOUNTER — Encounter: Payer: Self-pay | Admitting: Podiatry

## 2018-10-16 ENCOUNTER — Ambulatory Visit (INDEPENDENT_AMBULATORY_CARE_PROVIDER_SITE_OTHER): Payer: BC Managed Care – PPO

## 2018-10-16 ENCOUNTER — Ambulatory Visit (INDEPENDENT_AMBULATORY_CARE_PROVIDER_SITE_OTHER): Payer: BC Managed Care – PPO | Admitting: Podiatry

## 2018-10-16 ENCOUNTER — Other Ambulatory Visit: Payer: Self-pay

## 2018-10-16 VITALS — BP 142/71 | HR 61 | Temp 94.5°F

## 2018-10-16 DIAGNOSIS — M722 Plantar fascial fibromatosis: Secondary | ICD-10-CM

## 2018-10-16 MED ORDER — MELOXICAM 15 MG PO TABS: 15.0000 mg | ORAL_TABLET | Freq: Every day | ORAL | 1 refills | Status: DC

## 2018-10-16 MED ORDER — METHYLPREDNISOLONE 4 MG PO TBPK: ORAL_TABLET | ORAL | 0 refills | Status: DC

## 2018-10-18 NOTE — Progress Notes (Signed)
   Subjective: 58 y.o. male presenting today as a new patient with a chief complaint of burning pain to the plantar heels bilaterally that began 4-5 months ago. He states the right foot is worse than the left and it now feels like he is walking on a stone. He has not done anything for treatment. Walking increases the pain. Patient is here for further evaluation and treatment.   Past Medical History:  Diagnosis Date  . Bradycardia   . Heart murmur    unremarkable echo 2006  . Hypertension   . IBS (irritable bowel syndrome)    controlled with fiber supplement- diarrhea predominant     Objective: Physical Exam General: The patient is alert and oriented x3 in no acute distress.  Dermatology: Skin is warm, dry and supple bilateral lower extremities. Negative for open lesions or macerations bilateral.   Vascular: Dorsalis Pedis and Posterior Tibial pulses palpable bilateral.  Capillary fill time is immediate to all digits.  Neurological: Epicritic and protective threshold intact bilateral.   Musculoskeletal: Tenderness to palpation to the plantar aspect of the bilateral heels along the plantar fascia. All other joints range of motion within normal limits bilateral. Strength 5/5 in all groups bilateral.   Radiographic exam: Normal osseous mineralization. Joint spaces preserved. No fracture/dislocation/boney destruction. No other soft tissue abnormalities or radiopaque foreign bodies.   Assessment: 1. plantar fasciitis bilateral feet, right greater than left   Plan of Care:  1. Patient evaluated. Xrays reviewed.   2. Injection of 0.5cc Celestone soluspan injected into the right heel.  3. Rx for Medrol Dose Pak placed 4. Rx for Meloxicam ordered for patient. 5. Recommended good shoe gear.  6. Instructed patient regarding therapies and modalities at home to alleviate symptoms.  7. Return to clinic as needed.    Edrick Kins, DPM [facility]  Dr. Edrick Kins, DPM     [address], [place] [address]                Office [phone]  Fax [phone]

## 2019-02-05 ENCOUNTER — Ambulatory Visit (INDEPENDENT_AMBULATORY_CARE_PROVIDER_SITE_OTHER): Payer: BC Managed Care – PPO

## 2019-02-05 DIAGNOSIS — Z23 Encounter for immunization: Secondary | ICD-10-CM | POA: Diagnosis not present

## 2019-03-25 ENCOUNTER — Other Ambulatory Visit: Payer: Self-pay

## 2019-03-25 ENCOUNTER — Ambulatory Visit: Payer: BC Managed Care – PPO | Admitting: Podiatry

## 2019-03-25 DIAGNOSIS — M722 Plantar fascial fibromatosis: Secondary | ICD-10-CM | POA: Diagnosis not present

## 2019-03-25 MED ORDER — MELOXICAM 15 MG PO TABS: 15.0000 mg | ORAL_TABLET | Freq: Every day | ORAL | 3 refills | Status: DC

## 2019-03-27 NOTE — Progress Notes (Signed)
   Subjective: 58 y.o. male presenting today for follow up evaluation of bilateral plantar fasciitis. He states his pain has recently returned. He reports the pain was alleviated after receiving the injections at his last visit. He has been taking Meloxicam as directed. Being on the feet makes his symptoms worse. Patient is here for further evaluation and treatment.   Past Medical History:  Diagnosis Date  . Bradycardia   . Heart murmur    unremarkable echo 2006  . Hypertension   . IBS (irritable bowel syndrome)    controlled with fiber supplement- diarrhea predominant     Objective: Physical Exam General: The patient is alert and oriented x3 in no acute distress.  Dermatology: Skin is warm, dry and supple bilateral lower extremities. Negative for open lesions or macerations bilateral.   Vascular: Dorsalis Pedis and Posterior Tibial pulses palpable bilateral.  Capillary fill time is immediate to all digits.  Neurological: Epicritic and protective threshold intact bilateral.   Musculoskeletal: Tenderness to palpation to the plantar aspect of the bilateral heels along the plantar fascia. All other joints range of motion within normal limits bilateral. Strength 5/5 in all groups bilateral.    Assessment: 1. plantar fasciitis bilateral feet, right greater than left   Plan of Care:  1. Patient evaluated. 2. Injection of 0.5 mLs Celestone Soluspan injected into the bilateral plantar fascia.  3. Refill prescription for Meloxicam provided to patient.  4. OTC Powerstep insoles provided to patient.  5. Recommended New Balance shoes.  6. Return to clinic as needed.   On a bowling league.     Edrick Kins, DPM [facility]  Dr. Edrick Kins, DPM    [address], [place] [address]                Office [phone]  Fax [phone]

## 2019-04-01 ENCOUNTER — Other Ambulatory Visit: Payer: BC Managed Care – PPO

## 2019-04-01 DIAGNOSIS — Z20828 Contact with and (suspected) exposure to other viral communicable diseases: Secondary | ICD-10-CM | POA: Diagnosis not present

## 2019-04-01 DIAGNOSIS — U071 COVID-19: Secondary | ICD-10-CM | POA: Diagnosis not present

## 2019-04-14 ENCOUNTER — Other Ambulatory Visit: Payer: Self-pay | Admitting: Family Medicine

## 2019-04-14 DIAGNOSIS — I10 Essential (primary) hypertension: Secondary | ICD-10-CM

## 2019-04-18 ENCOUNTER — Other Ambulatory Visit (INDEPENDENT_AMBULATORY_CARE_PROVIDER_SITE_OTHER): Payer: 59

## 2019-04-18 ENCOUNTER — Other Ambulatory Visit: Payer: Self-pay

## 2019-04-18 DIAGNOSIS — I10 Essential (primary) hypertension: Secondary | ICD-10-CM

## 2019-04-18 LAB — LIPID PANEL
Cholesterol: 239 mg/dL — ABNORMAL HIGH (ref 0–200)
HDL: 61.7 mg/dL (ref >39.00)
LDL Cholesterol: 140 mg/dL — ABNORMAL HIGH (ref 0–99)
NonHDL: 177.17
Total CHOL/HDL Ratio: 4
Triglycerides: 188 mg/dL — ABNORMAL HIGH (ref 0.0–149.0)
VLDL: 37.6 mg/dL (ref 0.0–40.0)

## 2019-04-18 LAB — COMPREHENSIVE METABOLIC PANEL
ALT: 19 U/L (ref 0–53)
AST: 17 U/L (ref 0–37)
Albumin: 4 g/dL (ref 3.5–5.2)
Alkaline Phosphatase: 71 U/L (ref 39–117)
BUN: 14 mg/dL (ref 6–23)
CO2: 32 mEq/L (ref 19–32)
Calcium: 9 mg/dL (ref 8.4–10.5)
Chloride: 104 mEq/L (ref 96–112)
Creatinine, Ser: 0.96 mg/dL (ref 0.40–1.50)
GFR: 79.83 mL/min (ref >60.00)
Glucose, Bld: 94 mg/dL (ref 70–99)
Potassium: 4.2 mEq/L (ref 3.5–5.1)
Sodium: 142 mEq/L (ref 135–145)
Total Bilirubin: 0.5 mg/dL (ref 0.2–1.2)
Total Protein: 6.5 g/dL (ref 6.0–8.3)

## 2019-04-19 ENCOUNTER — Other Ambulatory Visit: Payer: BLUE CROSS/BLUE SHIELD

## 2019-04-22 ENCOUNTER — Encounter: Payer: Self-pay | Admitting: Family Medicine

## 2019-04-22 ENCOUNTER — Ambulatory Visit (INDEPENDENT_AMBULATORY_CARE_PROVIDER_SITE_OTHER): Payer: 59 | Admitting: Family Medicine

## 2019-04-22 ENCOUNTER — Other Ambulatory Visit: Payer: Self-pay

## 2019-04-22 VITALS — BP 144/80 | HR 60 | Temp 97.8°F | Ht 63.0 in | Wt 177.4 lb

## 2019-04-22 DIAGNOSIS — Z7189 Other specified counseling: Secondary | ICD-10-CM

## 2019-04-22 DIAGNOSIS — Z0000 Encounter for general adult medical examination without abnormal findings: Secondary | ICD-10-CM

## 2019-04-22 DIAGNOSIS — I10 Essential (primary) hypertension: Secondary | ICD-10-CM

## 2019-04-22 DIAGNOSIS — E7800 Pure hypercholesterolemia, unspecified: Secondary | ICD-10-CM

## 2019-04-22 DIAGNOSIS — M79673 Pain in unspecified foot: Secondary | ICD-10-CM

## 2019-04-22 MED ORDER — HYDROCHLOROTHIAZIDE 12.5 MG PO CAPS: 12.5000 mg | ORAL_CAPSULE | Freq: Every day | ORAL | 3 refills | Status: DC

## 2019-04-22 MED ORDER — ATORVASTATIN CALCIUM 10 MG PO TABS: 10.0000 mg | ORAL_TABLET | Freq: Every day | ORAL | 3 refills | Status: DC

## 2019-04-22 MED ORDER — LISINOPRIL 20 MG PO TABS: ORAL_TABLET | ORAL | 3 refills | Status: DC

## 2019-04-22 NOTE — Patient Instructions (Addendum)
Check with your insurance to see if they will cover the shingrix shot. Update me as needed.   Try taking atorvastatin.  If you have aches on the medicine then stop it and let me know. If you tolerate it, then recheck fasting labs in about 2-3 months.  Take care.  Glad to see you.

## 2019-04-22 NOTE — Progress Notes (Signed)
This visit occurred during the SARS-CoV-2 public health emergency.  Safety protocols were in place, including screening questions prior to the visit, additional usage of staff PPE, and extensive cleaning of exam room while observing appropriate contact time as indicated for disinfecting solutions.  CPE- See plan.  Routine anticipatory guidance given to patient.  See health maintenance.  The possibility exists that previously documented standard health maintenance information may have been brought forward from a previous encounter into this note.  If needed, that same information has been updated to reflect the current situation based on today's encounter.    Tetanus 2016 Flu shot done prev.  PNA shot not due, d/w pt. Shingrix d/w pt.   Colonoscopy 2016 Prostate cancer screening and PSA options (with potential risks and benefits of testing vs not testing) were discussed along with recent recs/guidelines. He declined testing PSA at this point. Living will d/w pt. Wife would be designated if patient were incapacitated.  Diet d/w pt.  Exercise- d/w pt.   HCV and HIV prev neg.    Hypertension:    Using medication without problems or lightheadedness: yes Chest pain with exertion:no Edema:no Short of breath:no Labs d/w pt.  Discussed statin start, he agrees.    Meloxicam used for foot pain, with relief.  No ADE on med.  Cr wnl. He has arch supports to help with foot pain.  Foot sx started after prolonged walking when he wasn't able to swim.    PMH and SH reviewed  Meds, vitals, and allergies reviewed.   ROS: Per HPI.  Unless specifically indicated otherwise in HPI, the patient denies:  General: fever. Eyes: acute vision changes ENT: sore throat Cardiovascular: chest pain Respiratory: SOB GI: vomiting GU: dysuria Musculoskeletal: acute back pain Derm: acute rash Neuro: acute motor dysfunction Psych: worsening mood Endocrine: polydipsia Heme: bleeding Allergy:  hayfever  GEN: nad, alert and oriented HEENT: ncat NECK: supple w/o LA CV: rrr. PULM: ctab, no inc wob ABD: soft, +bs EXT: no edema SKIN: no acute rash but mult small lipomas on the trunk- long standing  The 10-year ASCVD risk score Denman George DC Jr., et al., 2013) is: 12.4%   Values used to calculate the score:     Age: 89 years     Sex: Male     Is Non-Hispanic African American: No     Diabetic: No     Tobacco smoker: No     Systolic Blood Pressure: 144 mmHg     Is BP treated: Yes     HDL Cholesterol: 61.7 mg/dL     Total Cholesterol: 239 mg/dL

## 2019-04-24 DIAGNOSIS — M79673 Pain in unspecified foot: Secondary | ICD-10-CM | POA: Insufficient documentation

## 2019-04-24 NOTE — Assessment & Plan Note (Signed)
Continue meloxicam for now.  No adverse effect on medication.  Continue using arch supports.  Creatinine normal.  Routine cautions given.  He will update me as needed.

## 2019-04-24 NOTE — Assessment & Plan Note (Signed)
Tetanus 2016 Flu shot done prev.  PNA shot not due, d/w pt. Shingrix d/w pt.   Colonoscopy 2016 Prostate cancer screening and PSA options (with potential risks and benefits of testing vs not testing) were discussed along with recent recs/guidelines. He declined testing PSA at this point. Living will d/w pt. Wife would be designated if patient were incapacitated.  Diet d/w pt.  Exercise- d/w pt.   HCV and HIV prev neg.

## 2019-04-24 NOTE — Assessment & Plan Note (Signed)
  Living will d/w pt.  Wife would be designated if patient were incapacitated.   

## 2019-04-24 NOTE — Assessment & Plan Note (Signed)
Discussed statin start.  He will try Lipitor 10 mg a day.  If tolerated, recheck labs.  See after visit summary.  He agrees.  Continue work on diet and exercise.

## 2019-04-24 NOTE — Assessment & Plan Note (Signed)
Nearly controlled.  Would not change his medication at this point.  Continue work on diet and exercise.  He agrees.  He will update me as needed.  Labs discussed with patient.

## 2019-05-22 ENCOUNTER — Ambulatory Visit (INDEPENDENT_AMBULATORY_CARE_PROVIDER_SITE_OTHER): Payer: 59

## 2019-05-22 ENCOUNTER — Other Ambulatory Visit: Payer: Self-pay

## 2019-05-22 DIAGNOSIS — Z23 Encounter for immunization: Secondary | ICD-10-CM

## 2019-05-22 NOTE — Progress Notes (Signed)
Per orders of Dr. Para March with authorization for injection in office Dr. Rudene Re, injection of shingles vaccine given by Sherrie George. Patient tolerated injection well.

## 2019-05-29 ENCOUNTER — Telehealth: Payer: Self-pay | Admitting: Family Medicine

## 2019-05-29 NOTE — Telephone Encounter (Signed)
Email sent to billing requesting review.  

## 2019-05-29 NOTE — Telephone Encounter (Signed)
Representee with [employer] called today with member.  She stated that the patient came into the office for his physical in [place].  For the labs that were drawn on Apr 21, 2022 they were coded as hypertension and not as a routine lab draw for the physical. They wanted to know if this could be corrected,   Can you look into this? And see if the coding is correct?

## 2019-06-10 NOTE — Telephone Encounter (Signed)
Patient called back to get an update on bill. Patient aware Donald [name] is off until Friday. Please call patient back at [phone].

## 2019-06-14 NOTE — Telephone Encounter (Signed)
I spoke with the patient to relay information received from the billing department. Patient stated that he does not understand why it was coded this way as he has had hypercholesterol and hypertension for some time and has never received a bill for labs.   Dr. Para March, was there something different regarding the labs needed for this visit?

## 2019-06-14 NOTE — Telephone Encounter (Signed)
Dr. Para March.  I spoke with Dawn and everything was coded correctly in all visit. The patient's insurance has changed and that is why  He received a bill.   No review needed.

## 2019-06-16 NOTE — Telephone Encounter (Signed)
Is the patient aware?  If I need to call him then please let me know.  Thanks.

## 2019-06-18 NOTE — Telephone Encounter (Signed)
Patient is aware 

## 2019-06-18 NOTE — Telephone Encounter (Signed)
Noted. Thanks.

## 2019-07-31 ENCOUNTER — Other Ambulatory Visit (INDEPENDENT_AMBULATORY_CARE_PROVIDER_SITE_OTHER): Payer: 59

## 2019-07-31 ENCOUNTER — Encounter: Payer: Self-pay | Admitting: Family Medicine

## 2019-07-31 DIAGNOSIS — E7800 Pure hypercholesterolemia, unspecified: Secondary | ICD-10-CM | POA: Diagnosis not present

## 2019-07-31 LAB — LIPID PANEL
Cholesterol: 153 mg/dL (ref 0–200)
HDL: 62.4 mg/dL (ref >39.00)
LDL Cholesterol: 64 mg/dL (ref 0–99)
NonHDL: 90.52
Total CHOL/HDL Ratio: 2
Triglycerides: 131 mg/dL (ref 0.0–149.0)
VLDL: 26.2 mg/dL (ref 0.0–40.0)

## 2019-07-31 LAB — HEPATIC FUNCTION PANEL
ALT: 31 U/L (ref 0–53)
AST: 19 U/L (ref 0–37)
Albumin: 3.9 g/dL (ref 3.5–5.2)
Alkaline Phosphatase: 73 U/L (ref 39–117)
Bilirubin, Direct: 0.1 mg/dL (ref 0.0–0.3)
Total Bilirubin: 0.4 mg/dL (ref 0.2–1.2)
Total Protein: 6.2 g/dL (ref 6.0–8.3)

## 2019-08-14 ENCOUNTER — Ambulatory Visit (INDEPENDENT_AMBULATORY_CARE_PROVIDER_SITE_OTHER): Payer: 59

## 2019-08-14 DIAGNOSIS — Z23 Encounter for immunization: Secondary | ICD-10-CM

## 2019-08-14 NOTE — Progress Notes (Signed)
Per orders of Dr. Sharen Hones in Dr. Armanda Heritage absence, injection of shingrix given by Sherrie George. Patient tolerated injection well.

## 2019-08-26 ENCOUNTER — Encounter: Payer: Self-pay | Admitting: Family Medicine

## 2019-08-29 ENCOUNTER — Encounter: Payer: Self-pay | Admitting: Family Medicine

## 2019-08-29 ENCOUNTER — Other Ambulatory Visit: Payer: Self-pay | Admitting: Family Medicine

## 2019-09-12 ENCOUNTER — Encounter: Payer: Self-pay | Admitting: Family Medicine

## 2019-09-15 ENCOUNTER — Other Ambulatory Visit: Payer: Self-pay | Admitting: Family Medicine

## 2019-09-15 DIAGNOSIS — E7800 Pure hypercholesterolemia, unspecified: Secondary | ICD-10-CM

## 2019-09-15 MED ORDER — PRAVASTATIN SODIUM 10 MG PO TABS: 10.0000 mg | ORAL_TABLET | Freq: Every day | ORAL | 3 refills | Status: DC

## 2019-10-29 ENCOUNTER — Other Ambulatory Visit: Payer: Self-pay

## 2019-10-29 ENCOUNTER — Encounter: Payer: Self-pay | Admitting: Family Medicine

## 2019-10-29 ENCOUNTER — Ambulatory Visit: Payer: No Typology Code available for payment source | Admitting: Family Medicine

## 2019-10-29 DIAGNOSIS — H0289 Other specified disorders of eyelid: Secondary | ICD-10-CM

## 2019-10-29 MED ORDER — DIPHENHYDRAMINE HCL 2 % EX GEL: CUTANEOUS | 0 refills | Status: DC

## 2019-10-29 MED ORDER — LORATADINE 10 MG PO TABS
10.0000 mg | ORAL_TABLET | Freq: Every day | ORAL | Status: DC
Start: 2019-10-29 — End: 2020-03-17

## 2019-10-29 MED ORDER — KETOTIFEN FUMARATE 0.025 % OP SOLN
1.0000 [drp] | Freq: Two times a day (BID) | OPHTHALMIC | Status: DC | PRN
Start: 2019-10-29 — End: 2020-03-17

## 2019-10-29 NOTE — Progress Notes (Signed)
This visit occurred during the SARS-CoV-2 public health emergency.  Safety protocols were in place, including screening questions prior to the visit, additional usage of staff PPE, and extensive cleaning of exam room while observing appropriate contact time as indicated for disinfecting solutions.  L eye sx.  Off and on red eye over the summer.  He wears goggles swimming and doesn't have R eye ex.  Only noted this summer.  He has used OTC drops with some relief for a few days but then sx return.    More recently with rash on the L lower eye lid over the last few weeks.  Itches.  Hydrocortisone used but it burns some upon application to the skin.  Some local discharge.    No vision change, no eye pain.  He has some eye itching and injection but not pain.  No double vision.  No FCNAVD.  No ear pain.  No rhinorrhea.  Doesn't wear contacts.  Wears readers prn.    He has h/o relative bradycardia.  He is swimming more and that may contribute.  He is swimming ~1.5 miles 3 times a week.  He isn't lightheaded.    He has tolerated pravastatin.  He'll get follow up blood work if he continues to tolerate it.    Meds, vitals, and allergies reviewed.   ROS: Per HPI unless specifically indicated in ROS section   nad ncat PERRL EOMI No foreign bodies seen in either. He has some irritation on the right lower eyelid externally. No conjunctival injection seen in either eye. He does not have any ulceration or vesicles.  No hypersensitivity in any dermatome on the face. Neck supple.  No lymphadenopathy. Vision 20/20 bilaterally, 20/20 left, 20/20 with one mistake right, all done with correction.

## 2019-10-29 NOTE — Patient Instructions (Signed)
Use claritin 10mg  a day.  If needed, use OTC benadryl cream on the skin irritation and/or zaditor for eye irritation.  Okay to use saline drops liberally if needed.  If not better, then I want you to go to the eye clinic.  Take care.  Glad to see you.

## 2019-10-30 DIAGNOSIS — H0289 Other specified disorders of eyelid: Secondary | ICD-10-CM | POA: Insufficient documentation

## 2019-10-30 NOTE — Assessment & Plan Note (Signed)
Discussed options.  Okay for outpatient follow-up. Likely topical allergy but not shingles.  No pain.  No FB.   Use claritin 10mg  a day.  If needed, use OTC benadryl cream on the skin irritation and/or zaditor for eye irritation.  Okay to use saline drops liberally if needed.  If not better, then I want him to go to the eye clinic and he agrees with plan.

## 2019-11-13 ENCOUNTER — Telehealth: Payer: Self-pay | Admitting: *Deleted

## 2019-11-13 NOTE — Telephone Encounter (Signed)
Patient called stating that he was in a couple of week ago because of an eye problem. Patient stated that Dr. Para March advised him if the problem did not clear up he should go to an eye clinic. Patient stated that he has an eye doctor that he sees annually and wanted to know if it is okay to go to him. Advised patient if he has an eye doctor already he would probably be able to get a quicker appointment with him since he is an existing patient. Patient stated that he was not sure if Dr. Para March had a particular eye clinic in mind. Advised patient to call his eye doctor for an appointment and if he has a problem to call the office back.

## 2019-11-13 NOTE — Telephone Encounter (Signed)
Agreed.  Would start with his regular eye doctor.  We can always put in a referral if needed.  Will await update from patient.

## 2019-11-18 ENCOUNTER — Other Ambulatory Visit: Payer: Self-pay

## 2019-11-18 ENCOUNTER — Other Ambulatory Visit (INDEPENDENT_AMBULATORY_CARE_PROVIDER_SITE_OTHER): Payer: No Typology Code available for payment source

## 2019-11-18 DIAGNOSIS — E7800 Pure hypercholesterolemia, unspecified: Secondary | ICD-10-CM

## 2019-11-18 LAB — HEPATIC FUNCTION PANEL
ALT: 17 U/L (ref 0–53)
AST: 15 U/L (ref 0–37)
Albumin: 4 g/dL (ref 3.5–5.2)
Alkaline Phosphatase: 70 U/L (ref 39–117)
Bilirubin, Direct: 0.1 mg/dL (ref 0.0–0.3)
Total Bilirubin: 0.5 mg/dL (ref 0.2–1.2)
Total Protein: 6.6 g/dL (ref 6.0–8.3)

## 2019-11-18 LAB — LIPID PANEL
Cholesterol: 196 mg/dL (ref 0–200)
HDL: 61.5 mg/dL (ref >39.00)
LDL Cholesterol: 95 mg/dL (ref 0–99)
NonHDL: 134.42
Total CHOL/HDL Ratio: 3
Triglycerides: 196 mg/dL — ABNORMAL HIGH (ref 0.0–149.0)
VLDL: 39.2 mg/dL (ref 0.0–40.0)

## 2020-03-17 ENCOUNTER — Ambulatory Visit: Payer: No Typology Code available for payment source | Admitting: Family Medicine

## 2020-03-17 ENCOUNTER — Other Ambulatory Visit: Payer: Self-pay

## 2020-03-17 ENCOUNTER — Encounter: Payer: Self-pay | Admitting: Family Medicine

## 2020-03-17 VITALS — BP 152/80 | HR 54 | Temp 98.2°F | Ht 63.0 in | Wt 182.5 lb

## 2020-03-17 DIAGNOSIS — R21 Rash and other nonspecific skin eruption: Secondary | ICD-10-CM | POA: Insufficient documentation

## 2020-03-17 MED ORDER — TRIAMCINOLONE ACETONIDE 0.5 % EX CREA
1.0000 | TOPICAL_CREAM | Freq: Two times a day (BID) | CUTANEOUS | 0 refills | Status: DC
Start: 2020-03-17 — End: 2022-04-28

## 2020-03-17 MED ORDER — CEPHALEXIN 500 MG PO CAPS
500.0000 mg | ORAL_CAPSULE | Freq: Three times a day (TID) | ORAL | 0 refills | Status: DC
Start: 2020-03-17 — End: 2020-04-09

## 2020-03-17 NOTE — Assessment & Plan Note (Signed)
Consistent with nummular eczema with possible bacterial infection now in lesion on anterior thigh.   treat with topical steroid... if not improving in 24 hours.. start course of antibiotics.  no red flags for MRSA.

## 2020-03-17 NOTE — Patient Instructions (Addendum)
Start topical triamcinolone cream twice daily.  If rash is not improving in 24 hours with topical triamcinolone... can fill prescription for antibiotics.  Hold off an knee brace until improving and swimming.

## 2020-03-17 NOTE — Progress Notes (Signed)
Chief Complaint  Patient presents with  . Rash    Left Leg    History of Present Illness: HPI  59 year old male pt of Dr. Para March  presents with new onset rash. He reports 3-4 weeks ago noted red, raised swollen lesion on left lower leg and one on anterior thigh.   Seemed to improve with topical steroids, but then came back itchy and burning where wore a knee brace. He also applied and aloe lotion.  This AM skin spots were raised, red and crusty. No discharge   No sick contacts.  Knee brace ...  Rash was present prior to wearing knee brace.   He has been swimming  No  New lotions or laundry detergents.   no fever,  no chills.  No MRSA history personal or contact.     This visit occurred during the SARS-CoV-2 public health emergency.  Safety protocols were in place, including screening questions prior to the visit, additional usage of staff PPE, and extensive cleaning of exam room while observing appropriate contact time as indicated for disinfecting solutions.   COVID 19 screen:  No recent travel or known exposure to COVID19 The patient denies respiratory symptoms of COVID 19 at this time. The importance of social distancing was discussed today.     Review of Systems  Constitutional: Negative for chills and fever.  HENT: Negative for congestion and ear pain.   Eyes: Negative for pain and redness.  Respiratory: Negative for cough and shortness of breath.   Cardiovascular: Negative for chest pain, palpitations and leg swelling.  Gastrointestinal: Negative for abdominal pain, blood in stool, constipation, diarrhea, nausea and vomiting.  Genitourinary: Negative for dysuria.  Musculoskeletal: Negative for falls and myalgias.  Skin: Negative for rash.  Neurological: Negative for dizziness.  Psychiatric/Behavioral: Negative for depression. The patient is not nervous/anxious.       Past Medical History:  Diagnosis Date  . Bradycardia   . Heart murmur    unremarkable  echo 2006  . Hypertension   . IBS (irritable bowel syndrome)    controlled with fiber supplement- diarrhea predominant    reports that he has never smoked. He has never used smokeless tobacco. He reports that he does not drink alcohol and does not use drugs.   Current Outpatient Medications:  .  aspirin 81 MG tablet, Take 1 tablet by mouth every other day., Disp: , Rfl:  .  Biotin 70623 MCG TABS, Take by mouth., Disp: , Rfl:  .  Cholecalciferol (D3-50 PO), Take by mouth., Disp: , Rfl:  .  DIPHENHYDRAMINE HCL, TOPICAL, 2 % GEL, Use on skin twice a day if needed., Disp: , Rfl: 0 .  FIBER PO, Take by mouth. Equate Fiber-Take 1 tablespoon daily, Disp: , Rfl:  .  Flaxseed, Linseed, (FLAX SEED OIL) 1000 MG CAPS, Take 1 capsule by mouth daily. Flaxseed 1300 mg daily, Disp: , Rfl:  .  Garlic Oil 1000 MG CAPS, Take 1,000 mg by mouth daily. , Disp: , Rfl:  .  hydrochlorothiazide (MICROZIDE) 12.5 MG capsule, Take 1 capsule (12.5 mg total) by mouth daily., Disp: 90 capsule, Rfl: 3 .  lisinopril (ZESTRIL) 20 MG tablet, TAKE ONE TABLET BY MOUTH ONCE DAILY., Disp: 90 tablet, Rfl: 3 .  meloxicam (MOBIC) 15 MG tablet, Take 15 mg by mouth daily., Disp: , Rfl:  .  Multiple Vitamin (MULTIVITAMIN PO), Take by mouth daily.  , Disp: , Rfl:  .  pravastatin (PRAVACHOL) 10 MG tablet, Take 1 tablet (  10 mg total) by mouth daily., Disp: 90 tablet, Rfl: 3 .  Turmeric 500 MG CAPS, Take by mouth., Disp: , Rfl:  .  vitamin C (ASCORBIC ACID) 500 MG tablet, Take 1,000 mg by mouth daily. , Disp: , Rfl:    Observations/Objective: Blood pressure (!) 152/80, pulse (!) 54, temperature 98.2 F (36.8 C), temperature source Temporal, height 5\' 3"  (1.6 m), weight 182 lb 8 oz (82.8 kg), SpO2 97 %.  Physical Exam Constitutional:      Appearance: He is well-developed.  HENT:     Head: Normocephalic.     Right Ear: Hearing normal.     Left Ear: Hearing normal.     Nose: Nose normal.  Neck:     Thyroid: No thyroid mass or  thyromegaly.     Vascular: No carotid bruit.     Trachea: Trachea normal.  Cardiovascular:     Rate and Rhythm: Normal rate and regular rhythm.     Pulses: Normal pulses.     Heart sounds: Heart sounds not distant. No murmur heard.  No friction rub. No gallop.      Comments: No peripheral edema Pulmonary:     Effort: Pulmonary effort is normal. No respiratory distress.     Breath sounds: Normal breath sounds.      Skin:    General: Skin is warm and dry.     Findings: No rash.     Comments: 2 skin lesions... one on lower anterior thigh and one on left posterior calf  see pictures  Psychiatric:        Speech: Speech normal.        Behavior: Behavior normal.        Thought Content: Thought content normal.      Assessment and Plan   Skin rash Consistent with nummular eczema with possible bacterial infection now in lesion on anterior thigh.   treat with topical steroid... if not improving in 24 hours.. start course of antibiotics.  no red flags for MRSA.     , MD

## 2020-04-06 ENCOUNTER — Other Ambulatory Visit: Payer: Self-pay | Admitting: Family Medicine

## 2020-04-06 ENCOUNTER — Encounter: Payer: Self-pay | Admitting: Family Medicine

## 2020-04-06 ENCOUNTER — Telehealth: Payer: Self-pay | Admitting: Family Medicine

## 2020-04-06 DIAGNOSIS — E7800 Pure hypercholesterolemia, unspecified: Secondary | ICD-10-CM

## 2020-04-06 NOTE — Telephone Encounter (Addendum)
Please triage patient about his leg rash.  He sent a MyChart message but I think it needs to be addressed with a phone call initially.  I am not in the office Tuesday to see the patient.  I thank you for your help.  Please call me if needed.

## 2020-04-07 NOTE — Telephone Encounter (Signed)
I spoke with pt; pt said pt had visit with Dr Ermalene Searing and after taking abx and using cream rash went away. Pt did some work and used knee brace and rash came back; pt will not use knee brace;pt has rash on lt leg and knee; cream is not helping. Pt said no itching and no drainage or fever and no blister like areas seen. Pt wants to be seen in office. First available appt scheduled with Dr Patsy Lager on 04/09/20 at 8:40 AM. No covid symptoms; UC precautions given and pt voiced understanding. FYI to Dr Para March as PCP and Dr Patsy Lager.

## 2020-04-07 NOTE — Telephone Encounter (Addendum)
Noted. Thanks. I will send him a message also.

## 2020-04-08 NOTE — Progress Notes (Signed)
Donald Lindeman T. Donald Coca, MD, CAQ Sports Medicine  Primary Care and Sports Medicine [facility] [address] [place], [address]  Phone: [phone]  FAX: [phone]  Donald [name] - 59 y.o. male  MRN [id number]  Date of Birth: [birth year]  Date: 04/09/2020  PCP: Joaquim Nam, MD  Referral: Joaquim Nam, MD  Chief Complaint  Patient presents with  . Rash    Left Leg    This visit occurred during the SARS-CoV-2 public health emergency.  Safety protocols were in place, including screening questions prior to the visit, additional usage of staff PPE, and extensive cleaning of exam room while observing appropriate contact time as indicated for disinfecting solutions.   Subjective:   Donald [name] is a 59 y.o. very pleasant male patient with Body mass index is 32.64 kg/m. who presents with the following:  He presents with a left-sided lower extremity rash.    He saw my partner Dr. Ermalene Searing on March 17, 2020, and this rash is mostly present around the knee.  He did take some antibiotics.  He is also tried some triamcinolone cream.  This is not warm, but it does have some itchiness.  L knee brace  Review of Systems is noted in the HPI, as appropriate  Objective:   BP (!) 150/76   Pulse (!) 54   Temp 98.4 F (36.9 C) (Temporal)   Ht 5\' 3"  (1.6 m)   Wt 184 lb 4 oz (83.6 kg)   SpO2 96%   BMI 32.64 kg/m   GEN: No acute distress; alert,appropriate. PULM: Breathing comfortably in no respiratory distress PSYCH: Normally interactive.       ```````  Laboratory and Imaging Data:  Assessment and Plan:     ICD-10-CM   1. Rash  R21    This does not appear to be bacterial infection or cellulitis.  Suspect some form of contact dermatitis/allergic dermatitis.  I am going to give him some steroids to take and continue with the topical triamcinolone.  Meds ordered this encounter  Medications  . predniSONE (DELTASONE) 20 MG  tablet    Sig: 2 tabs po daily for 5 days, then 1 tab po daily for 5 days    Dispense:  15 tablet    Refill:  0   Follow-up: No follow-ups on file.  Signed,  . Willis Holquin, MD   Outpatient Encounter Medications as of 04/09/2020  Medication Sig  . aspirin 81 MG tablet Take 1 tablet by mouth every other day.  . Biotin 04/11/2020 MCG TABS Take by mouth.  . Cholecalciferol (D3-50 PO) Take by mouth.  94496 DIPHENHYDRAMINE HCL, TOPICAL, 2 % GEL Use on skin twice a day if needed.  Marland Kitchen FIBER PO Take by mouth. Equate Fiber-Take 1 tablespoon daily  . Flaxseed, Linseed, (FLAX SEED OIL) 1000 MG CAPS Take 1 capsule by mouth daily. Flaxseed 1300 mg daily  . Garlic Oil 1000 MG CAPS Take 1,000 mg by mouth daily.  . hydrochlorothiazide (MICROZIDE) 12.5 MG capsule Take 1 capsule (12.5 mg total) by mouth daily.  Marland Kitchen lisinopril (ZESTRIL) 20 MG tablet TAKE ONE TABLET BY MOUTH ONCE DAILY.  . meloxicam (MOBIC) 15 MG tablet Take 15 mg by mouth daily.  . Multiple Vitamin (MULTIVITAMIN PO) Take by mouth daily.  . pravastatin (PRAVACHOL) 10 MG tablet Take 1 tablet (10 mg total) by mouth daily.  . predniSONE (DELTASONE) 20 MG tablet 2 tabs po daily for 5 days, then  1 tab po daily for 5 days  . triamcinolone cream (KENALOG) 0.5 % Apply 1 application topically 2 (two) times daily.  . Turmeric 500 MG CAPS Take by mouth.  . vitamin C (ASCORBIC ACID) 500 MG tablet Take 1,000 mg by mouth daily.  . [DISCONTINUED] cephALEXin (KEFLEX) 500 MG capsule Take 1 capsule (500 mg total) by mouth 3 (three) times daily.   No facility-administered encounter medications on file as of 04/09/2020.

## 2020-04-09 ENCOUNTER — Ambulatory Visit: Payer: No Typology Code available for payment source | Admitting: Family Medicine

## 2020-04-09 ENCOUNTER — Encounter: Payer: Self-pay | Admitting: Family Medicine

## 2020-04-09 ENCOUNTER — Other Ambulatory Visit: Payer: Self-pay

## 2020-04-09 VITALS — BP 150/76 | HR 54 | Temp 98.4°F | Ht 63.0 in | Wt 184.2 lb

## 2020-04-09 DIAGNOSIS — R21 Rash and other nonspecific skin eruption: Secondary | ICD-10-CM

## 2020-04-09 MED ORDER — PREDNISONE 20 MG PO TABS: ORAL_TABLET | ORAL | 0 refills | Status: DC

## 2020-04-17 ENCOUNTER — Other Ambulatory Visit: Payer: Self-pay

## 2020-04-20 ENCOUNTER — Other Ambulatory Visit: Payer: Self-pay

## 2020-04-20 ENCOUNTER — Other Ambulatory Visit (INDEPENDENT_AMBULATORY_CARE_PROVIDER_SITE_OTHER): Payer: No Typology Code available for payment source

## 2020-04-20 DIAGNOSIS — E7800 Pure hypercholesterolemia, unspecified: Secondary | ICD-10-CM | POA: Diagnosis not present

## 2020-04-20 LAB — COMPREHENSIVE METABOLIC PANEL
ALT: 16 U/L (ref 0–53)
AST: 12 U/L (ref 0–37)
Albumin: 4 g/dL (ref 3.5–5.2)
Alkaline Phosphatase: 58 U/L (ref 39–117)
BUN: 23 mg/dL (ref 6–23)
CO2: 32 mEq/L (ref 19–32)
Calcium: 8.8 mg/dL (ref 8.4–10.5)
Chloride: 105 mEq/L (ref 96–112)
Creatinine, Ser: 1.05 mg/dL (ref 0.40–1.50)
GFR: 76.75 mL/min (ref >60.00)
Glucose, Bld: 90 mg/dL (ref 70–99)
Potassium: 3.7 mEq/L (ref 3.5–5.1)
Sodium: 142 mEq/L (ref 135–145)
Total Bilirubin: 0.5 mg/dL (ref 0.2–1.2)
Total Protein: 6.2 g/dL (ref 6.0–8.3)

## 2020-04-20 LAB — LIPID PANEL
Cholesterol: 147 mg/dL (ref 0–200)
HDL: 71.7 mg/dL (ref >39.00)
LDL Cholesterol: 50 mg/dL (ref 0–99)
NonHDL: 75.07
Total CHOL/HDL Ratio: 2
Triglycerides: 126 mg/dL (ref 0.0–149.0)
VLDL: 25.2 mg/dL (ref 0.0–40.0)

## 2020-04-23 ENCOUNTER — Ambulatory Visit (INDEPENDENT_AMBULATORY_CARE_PROVIDER_SITE_OTHER): Payer: No Typology Code available for payment source | Admitting: Family Medicine

## 2020-04-23 ENCOUNTER — Other Ambulatory Visit: Payer: Self-pay

## 2020-04-23 ENCOUNTER — Encounter: Payer: Self-pay | Admitting: Family Medicine

## 2020-04-23 VITALS — BP 140/70 | HR 61 | Temp 97.7°F | Ht 63.0 in | Wt 180.5 lb

## 2020-04-23 DIAGNOSIS — Z0000 Encounter for general adult medical examination without abnormal findings: Secondary | ICD-10-CM | POA: Diagnosis not present

## 2020-04-23 DIAGNOSIS — Z7189 Other specified counseling: Secondary | ICD-10-CM

## 2020-04-23 DIAGNOSIS — I10 Essential (primary) hypertension: Secondary | ICD-10-CM

## 2020-04-23 DIAGNOSIS — E7800 Pure hypercholesterolemia, unspecified: Secondary | ICD-10-CM

## 2020-04-23 DIAGNOSIS — R21 Rash and other nonspecific skin eruption: Secondary | ICD-10-CM

## 2020-04-23 MED ORDER — PRAVASTATIN SODIUM 10 MG PO TABS: 10.0000 mg | ORAL_TABLET | Freq: Every day | ORAL | 3 refills | Status: DC

## 2020-04-23 MED ORDER — FLUOCINONIDE 0.05 % EX CREA: 1.0000 "application " | TOPICAL_CREAM | Freq: Two times a day (BID) | CUTANEOUS | 1 refills | Status: DC | PRN

## 2020-04-23 MED ORDER — LISINOPRIL 20 MG PO TABS: ORAL_TABLET | ORAL | 3 refills | Status: DC

## 2020-04-23 MED ORDER — HYDROCHLOROTHIAZIDE 12.5 MG PO CAPS: 12.5000 mg | ORAL_CAPSULE | Freq: Every day | ORAL | 3 refills | Status: DC

## 2020-04-23 NOTE — Progress Notes (Signed)
This visit occurred during the SARS-CoV-2 public health emergency.  Safety protocols were in place, including screening questions prior to the visit, additional usage of staff PPE, and extensive cleaning of exam room while observing appropriate contact time as indicated for disinfecting solutions.  CPE- See plan.  Routine anticipatory guidance given to patient.  See health maintenance.  The possibility exists that previously documented standard health maintenance information may have been brought forward from a previous encounter into this note.  If needed, that same information has been updated to reflect the current situation based on today's encounter.    Tetanus 2016 Flu shot done prev.  PNA shot not due, d/w pt. Shingrix done covid done, booster encouraged.   Colonoscopy 2016 Prostate cancer screening and PSA options (with potential risks and benefits of testing vs not testing) were discussed along with recent recs/guidelines. He declined testing PSA at this point. Living will d/w pt. Wife would be designated if patient were incapacitated.  Diet d/w pt.  Exercise- d/w pt.   HCV and HIV prev neg.    Rash on L knee.  Present for weeks.  He thought it was from the knee sleeve.  It is better in the meantime.  He does better with a fabric barrier between the skin and the sleeve.  He is trying to find an alternative material.  TAC creamed helped some, prednisone helped more.    Hypertension:    Using medication without problems or lightheadedness: yes Chest pain with exertion:no Edema:no Short of breath:no Labs d/w pt.    Elevated Cholesterol: Using medications without problems: yes Muscle aches: no Diet compliance: yes Exercise: yes Labs d/w pt.    L lower eyelid irritation.  It was better over the winter then earlier this week it got irritated locally again.  Had seen eye clinic, given an ointment and improved with that.  I asked him to update the eye clinic.    No sx  currently.    PMH and SH reviewed  Meds, vitals, and allergies reviewed.   ROS: Per HPI.  Unless specifically indicated otherwise in HPI, the patient denies:  General: fever. Eyes: acute vision changes ENT: sore throat Cardiovascular: chest pain Respiratory: SOB GI: vomiting GU: dysuria Musculoskeletal: acute back pain Derm: acute rash Neuro: acute motor dysfunction Psych: worsening mood Endocrine: polydipsia Heme: bleeding Allergy: hayfever  GEN: nad, alert and oriented HEENT: ncat NECK: supple w/o LA CV: rrr. PULM: ctab, no inc wob ABD: soft, +bs EXT: no edema SKIN: no acute rash except for minimal residual rash on the left knee, especially in the popliteal area.  No ulceration.

## 2020-04-23 NOTE — Patient Instructions (Signed)
If the triamcinolone doesn't resolve the rash, then change to the new rx.  Switch to a different brace.     Ask the eye clinic about the eyelid rash.   Update me as needed.  Take care.  Glad to see you.

## 2020-04-27 NOTE — Assessment & Plan Note (Signed)
Controlled.  Continue pravastatin.  Continue work on diet and exercise.  He agrees.

## 2020-04-27 NOTE — Assessment & Plan Note (Signed)
  Living will d/w pt.  Wife would be designated if patient were incapacitated.   

## 2020-04-27 NOTE — Assessment & Plan Note (Signed)
Controlled.  Continue hydrochlorothiazide and lisinopril.  Continue work on diet and exercise.  Labs discussed with patient.

## 2020-04-27 NOTE — Assessment & Plan Note (Signed)
Likely contact dermatitis from the knee sleeve.  He is trying to find an alternative sleeve that he can use.  Change in material is likely the best option.  In the meantime he can use fluocinonide if needed with routine cautions.  He agrees with plan.

## 2020-04-27 NOTE — Assessment & Plan Note (Signed)
Tetanus 2016 Flu shot done prev.  PNA shot not due, d/w pt. Shingrix done covid done, booster encouraged.   Colonoscopy 2016 Prostate cancer screening and PSA options (with potential risks and benefits of testing vs not testing) were discussed along with recent recs/guidelines. He declined testing PSA at this point. Living will d/w pt. Wife would be designated if patient were incapacitated.  Diet d/w pt.  Exercise- d/w pt.   HCV and HIV prev neg.

## 2021-04-14 ENCOUNTER — Other Ambulatory Visit: Payer: Self-pay

## 2021-04-14 ENCOUNTER — Other Ambulatory Visit: Payer: Self-pay | Admitting: Family Medicine

## 2021-04-14 ENCOUNTER — Telehealth (INDEPENDENT_AMBULATORY_CARE_PROVIDER_SITE_OTHER): Payer: 59 | Admitting: Internal Medicine

## 2021-04-14 ENCOUNTER — Encounter: Payer: Self-pay | Admitting: Family Medicine

## 2021-04-14 ENCOUNTER — Encounter: Payer: Self-pay | Admitting: Internal Medicine

## 2021-04-14 DIAGNOSIS — U071 COVID-19: Secondary | ICD-10-CM | POA: Insufficient documentation

## 2021-04-14 DIAGNOSIS — I10 Essential (primary) hypertension: Secondary | ICD-10-CM | POA: Diagnosis not present

## 2021-04-14 DIAGNOSIS — E7800 Pure hypercholesterolemia, unspecified: Secondary | ICD-10-CM

## 2021-04-14 MED ORDER — NIRMATRELVIR/RITONAVIR (PAXLOVID)TABLET: 3.0000 | ORAL_TABLET | Freq: Two times a day (BID) | ORAL | 0 refills | Status: AC

## 2021-04-14 NOTE — Progress Notes (Signed)
Patient ID: Donald [name], male   DOB: 1961-02-24, 61 y.o.   MRN: [id number]  Virtual Visit via Video Note  I connected with Donald [name] on 04/14/21 at  2:40 PM EST by a video enabled telemedicine application and verified that I am speaking with the correct person using two identifiers.  Location of all participants today Patient: at home Provider: at office   I discussed the limitations of evaluation and management by telemedicine and the availability of in person appointments. The patient expressed understanding and agreed to proceed.  History of Present Illness: Here with covid like symptoms x 2 days, with home testing + last pm.  S/p COVID vax x 2, no boosters.  Has hx of covid infection dec 2020 as well.  Symptoms this time includes fever, HA, achy all over, cough, ST but no loss of taste or smeel, Pt denies chest pain, increased sob or doe, wheezing, orthopnea, PND, increased LE swelling, palpitations, dizziness or syncope.  And Denies worsening reflux, abd pain, dysphagia, n/v, bowel change or blood.   Pt denies recent wt loss, night sweats, or other constitutional symptoms Past Medical History:  Diagnosis Date   Bradycardia    Heart murmur    unremarkable echo 2006   Hyperlipidemia    Hypertension    IBS (irritable bowel syndrome)    controlled with fiber supplement- diarrhea predominant   Past Surgical History:  Procedure Laterality Date   APPENDECTOMY  60 years old   SHOULDER SURGERY Left 2014    reports that he has never smoked. He has never used smokeless tobacco. He reports that he does not drink alcohol and does not use drugs. family history includes Cancer in his paternal grandfather; Heart disease in his father, maternal grandfather, and maternal grandmother; Hyperlipidemia in his mother; Hypertension in his brother, brother, father, and mother; Kidney disease in his mother; Lung disease in his brother; Obesity in his sister. Allergies  Allergen Reactions   Lipitor  [Atorvastatin]     Myalgias with 10 mg a day   Other     Neoprene sleeve- contact dermatitis   Current Outpatient Medications on File Prior to Visit  Medication Sig Dispense Refill   aspirin 81 MG tablet Take 1 tablet by mouth every other day.     Biotin 10000 MCG TABS Take by mouth.     Cholecalciferol (D3-50 PO) Take by mouth.     DIPHENHYDRAMINE HCL, TOPICAL, 2 % GEL Use on skin twice a day if needed.  0   FIBER PO Take by mouth. Equate Fiber-Take 1 tablespoon daily     Flaxseed, Linseed, (FLAX SEED OIL) 1000 MG CAPS Take 1 capsule by mouth daily. Flaxseed 1300 mg daily     fluocinonide cream (LIDEX) [id number] % Apply 1 application topically 2 (two) times daily as needed. Don't use on the face. 30 g 1   Garlic Oil [id number] MG CAPS Take 1,000 mg by mouth daily.     hydrochlorothiazide (MICROZIDE) 12.5 MG capsule Take 1 capsule (12.5 mg total) by mouth daily. 90 capsule 3   lisinopril (ZESTRIL) 20 MG tablet TAKE ONE TABLET BY MOUTH ONCE DAILY. 90 tablet 3   Multiple Vitamin (MULTIVITAMIN PO) Take by mouth daily.     pravastatin (PRAVACHOL) 10 MG tablet Take 1 tablet (10 mg total) by mouth daily. 90 tablet 3   triamcinolone cream (KENALOG) 0.5 % Apply 1 application topically 2 (two) times daily. 30 g 0   Turmeric 500 MG CAPS Take by  mouth.     vitamin C (ASCORBIC ACID) 500 MG tablet Take 1,000 mg by mouth daily.     No current facility-administered medications on file prior to visit.    Observations/Objective: Alert, NAD, appropriate mood and affect, resps normal, cn 2-12 intact, moves all 4s, no visible rash or swelling Lab Results  Component Value Date   WBC 8.2 12/12/2012   HGB 16.3 12/12/2012   HCT 44.7 12/12/2012   PLT 195 12/12/2012   GLUCOSE 90 04/20/2020   CHOL 147 04/20/2020   TRIG 126.0 04/20/2020   HDL 71.70 04/20/2020   LDLDIRECT 146.0 04/12/2018   LDLCALC 50 04/20/2020   ALT 16 04/20/2020   AST 12 04/20/2020   NA 142 04/20/2020   K 3.7 04/20/2020   CL 105 04/20/2020    CREATININE 1.05 04/20/2020   BUN 23 04/20/2020   CO2 32 04/20/2020   TSH 1.04 04/12/2018   PSA 1.29 03/21/2012   Assessment and Plan: See notes  Follow Up Instructions: See notes   I discussed the assessment and treatment plan with the patient. The patient was provided an opportunity to ask questions and all were answered. The patient agreed with the plan and demonstrated an understanding of the instructions.   The patient was advised to call back or seek an in-person evaluation if the symptoms worsen or if the condition fails to improve as anticipated.   Cathlean Cower, MD

## 2021-04-14 NOTE — Telephone Encounter (Signed)
Spoke to patient by telephone and was advised that he has tested positive for covid. Patient stated that he started feeling real tired Monday. Patient scheduled for a video visit today with Dr. Jonny Ruiz at 2:40 pm. Patient was given ER precautions and verbalized understanding.

## 2021-04-14 NOTE — Assessment & Plan Note (Signed)
Mild to mod, for paxlovid course,  to f/u any worsening symptoms or concerns 

## 2021-04-14 NOTE — Patient Instructions (Signed)
Please take all new medication as prescribed - the paxlovid  Ok to hold the pravastatin for 10 days  when starting the paxlovid  Please continue all other medications as before, and refills have been done if requested.  Please have the pharmacy call with any other refills you may need.  Please keep your appointments with your specialists as you may have planned

## 2021-04-14 NOTE — Assessment & Plan Note (Signed)
Pt encouraged to continue to monitor BP on regular basis at home, cont current tx - hct, lisinopril

## 2021-04-16 ENCOUNTER — Encounter: Payer: Self-pay | Admitting: Internal Medicine

## 2021-04-19 ENCOUNTER — Other Ambulatory Visit: Payer: No Typology Code available for payment source

## 2021-04-22 ENCOUNTER — Other Ambulatory Visit: Payer: Self-pay | Admitting: Family Medicine

## 2021-04-26 ENCOUNTER — Ambulatory Visit (INDEPENDENT_AMBULATORY_CARE_PROVIDER_SITE_OTHER): Payer: 59 | Admitting: Family Medicine

## 2021-04-26 ENCOUNTER — Other Ambulatory Visit: Payer: Self-pay

## 2021-04-26 ENCOUNTER — Encounter: Payer: Self-pay | Admitting: Family Medicine

## 2021-04-26 VITALS — BP 124/80 | HR 57 | Temp 98.0°F | Ht 63.0 in | Wt 182.0 lb

## 2021-04-26 DIAGNOSIS — E7800 Pure hypercholesterolemia, unspecified: Secondary | ICD-10-CM

## 2021-04-26 DIAGNOSIS — I10 Essential (primary) hypertension: Secondary | ICD-10-CM

## 2021-04-26 DIAGNOSIS — Z0000 Encounter for general adult medical examination without abnormal findings: Secondary | ICD-10-CM | POA: Diagnosis not present

## 2021-04-26 DIAGNOSIS — K219 Gastro-esophageal reflux disease without esophagitis: Secondary | ICD-10-CM

## 2021-04-26 DIAGNOSIS — Z7189 Other specified counseling: Secondary | ICD-10-CM

## 2021-04-26 LAB — COMPREHENSIVE METABOLIC PANEL
ALT: 18 U/L (ref 0–53)
AST: 16 U/L (ref 0–37)
Albumin: 4.2 g/dL (ref 3.5–5.2)
Alkaline Phosphatase: 80 U/L (ref 39–117)
BUN: 17 mg/dL (ref 6–23)
CO2: 28 mEq/L (ref 19–32)
Calcium: 9.1 mg/dL (ref 8.4–10.5)
Chloride: 105 mEq/L (ref 96–112)
Creatinine, Ser: 0.84 mg/dL (ref 0.40–1.50)
GFR: 93.62 mL/min (ref >60.00)
Glucose, Bld: 93 mg/dL (ref 70–99)
Potassium: 4.2 mEq/L (ref 3.5–5.1)
Sodium: 142 mEq/L (ref 135–145)
Total Bilirubin: 0.5 mg/dL (ref 0.2–1.2)
Total Protein: 7 g/dL (ref 6.0–8.3)

## 2021-04-26 LAB — LIPID PANEL
Cholesterol: 245 mg/dL — ABNORMAL HIGH (ref 0–200)
HDL: 61.4 mg/dL (ref >39.00)
LDL Cholesterol: 150 mg/dL — ABNORMAL HIGH (ref 0–99)
NonHDL: 183.7
Total CHOL/HDL Ratio: 4
Triglycerides: 169 mg/dL — ABNORMAL HIGH (ref 0.0–149.0)
VLDL: 33.8 mg/dL (ref 0.0–40.0)

## 2021-04-26 NOTE — Patient Instructions (Signed)
Go to the lab on the way out.   If you have mychart we'll likely use that to update you.    °Take care.  Glad to see you. °Update me as needed.   °

## 2021-04-26 NOTE — Progress Notes (Signed)
This visit occurred during the SARS-CoV-2 public health emergency.  Safety protocols were in place, including screening questions prior to the visit, additional usage of staff PPE, and extensive cleaning of exam room while observing appropriate contact time as indicated for disinfecting solutions.  CPE- See plan.  Routine anticipatory guidance given to patient.  See health maintenance.  The possibility exists that previously documented standard health maintenance information may have been brought forward from a previous encounter into this note.  If needed, that same information has been updated to reflect the current situation based on today's encounter.    Tetanus 2016 Flu shot done prev.  PNA shot not due, d/w pt.  Shingrix done covid done prev.   Colonoscopy 2016 Prostate cancer screening and PSA options (with potential risks and benefits of testing vs not testing) were discussed along with recent recs/guidelines.  He declined testing PSA at this point.  Living will d/w pt.  Wife would be designated if patient were incapacitated.   Diet d/w pt.   Exercise- d/w pt.   HCV and HIV prev neg.    Recent covid infection d/w pt.  He is better in the meantime.  No fevers, no chills, no cough.    Started taking OTC omeprazole for GERD and that helped.  D/w pt about trial of tapering med.  He can update me as needed.    Hypertension:    Using medication without problems or lightheadedness: yes Chest pain with exertion:no Edema:no Short of breath: no. Labs pending.    Still swimming for exercise.    Elevated Cholesterol: Using medications without problems: yes Muscle aches: no Diet compliance: yes Exercise:yes  Labs pending.  He was off pravastatin for 10 days due to paxlovid use.  We can take that into account on his labs.  His father is on hospice care, d/w pt.  His brother in law recent died, condolences offered.  I thanked him for his effort.  PMH and SH reviewed  Meds, vitals,  and allergies reviewed.   ROS: Per HPI.  Unless specifically indicated otherwise in HPI, the patient denies:  General: fever. Eyes: acute vision changes ENT: sore throat Cardiovascular: chest pain Respiratory: SOB GI: vomiting GU: dysuria Musculoskeletal: acute back pain Derm: acute rash Neuro: acute motor dysfunction Psych: worsening mood Endocrine: polydipsia Heme: bleeding Allergy: hayfever  GEN: nad, alert and oriented HEENT: ncat NECK: supple w/o LA CV: rrr. PULM: ctab, no inc wob ABD: soft, +bs EXT: no edema SKIN: no acute rash

## 2021-04-28 NOTE — Assessment & Plan Note (Signed)
  Living will d/w pt.  Wife would be designated if patient were incapacitated.   

## 2021-04-28 NOTE — Assessment & Plan Note (Signed)
Tetanus 2016 Flu shot done prev.  PNA shot not due, d/w pt.  Shingrix done covid done prev.   Colonoscopy 2016 Prostate cancer screening and PSA options (with potential risks and benefits of testing vs not testing) were discussed along with recent recs/guidelines.  He declined testing PSA at this point.  Living will d/w pt.  Wife would be designated if patient were incapacitated.   Diet d/w pt.   Exercise- d/w pt.   HCV and HIV prev neg.  

## 2021-04-28 NOTE — Assessment & Plan Note (Signed)
Continue hydrochlorothiazide and lisinopril.  Labs pending.  Continue work on diet and exercise.

## 2021-04-28 NOTE — Assessment & Plan Note (Signed)
Started taking OTC omeprazole for GERD and that helped.  D/w pt about trial of tapering med.  He can update me as needed.

## 2021-04-28 NOTE — Assessment & Plan Note (Signed)
Continue pravastatin Labs pending.  He was off pravastatin for 10 days due to paxlovid use.  We can take that into account on his labs.

## 2021-05-26 ENCOUNTER — Other Ambulatory Visit: Payer: Self-pay | Admitting: Family Medicine

## 2021-05-28 ENCOUNTER — Encounter: Payer: Self-pay | Admitting: Family Medicine

## 2021-05-28 MED ORDER — CLOTRIMAZOLE-BETAMETHASONE 1-0.05 % EX CREA: 1.0000 "application " | TOPICAL_CREAM | Freq: Every day | CUTANEOUS | 0 refills | Status: DC

## 2021-05-30 ENCOUNTER — Other Ambulatory Visit: Payer: Self-pay | Admitting: Family Medicine

## 2021-05-30 MED ORDER — CLOTRIMAZOLE-BETAMETHASONE 1-0.05 % EX CREA: 1.0000 "application " | TOPICAL_CREAM | Freq: Every day | CUTANEOUS | Status: DC

## 2021-09-21 DIAGNOSIS — M1712 Unilateral primary osteoarthritis, left knee: Secondary | ICD-10-CM | POA: Diagnosis not present

## 2021-09-21 DIAGNOSIS — Z4789 Encounter for other orthopedic aftercare: Secondary | ICD-10-CM | POA: Diagnosis not present

## 2021-10-14 DIAGNOSIS — M170 Bilateral primary osteoarthritis of knee: Secondary | ICD-10-CM | POA: Diagnosis not present

## 2021-10-14 DIAGNOSIS — M1712 Unilateral primary osteoarthritis, left knee: Secondary | ICD-10-CM | POA: Diagnosis not present

## 2021-10-26 DIAGNOSIS — M1712 Unilateral primary osteoarthritis, left knee: Secondary | ICD-10-CM | POA: Insufficient documentation

## 2021-11-05 DIAGNOSIS — M1712 Unilateral primary osteoarthritis, left knee: Secondary | ICD-10-CM | POA: Diagnosis not present

## 2021-11-12 DIAGNOSIS — M1712 Unilateral primary osteoarthritis, left knee: Secondary | ICD-10-CM | POA: Diagnosis not present

## 2022-04-10 ENCOUNTER — Other Ambulatory Visit: Payer: Self-pay | Admitting: Family Medicine

## 2022-04-10 DIAGNOSIS — E7800 Pure hypercholesterolemia, unspecified: Secondary | ICD-10-CM

## 2022-04-13 ENCOUNTER — Other Ambulatory Visit: Payer: Self-pay | Admitting: Family Medicine

## 2022-04-21 ENCOUNTER — Other Ambulatory Visit (INDEPENDENT_AMBULATORY_CARE_PROVIDER_SITE_OTHER): Payer: 59

## 2022-04-21 DIAGNOSIS — E7800 Pure hypercholesterolemia, unspecified: Secondary | ICD-10-CM

## 2022-04-21 LAB — LIPID PANEL
Cholesterol: 194 mg/dL (ref 0–200)
HDL: 59.1 mg/dL (ref >39.00)
LDL Cholesterol: 107 mg/dL — ABNORMAL HIGH (ref 0–99)
NonHDL: 134.87
Total CHOL/HDL Ratio: 3
Triglycerides: 141 mg/dL (ref 0.0–149.0)
VLDL: 28.2 mg/dL (ref 0.0–40.0)

## 2022-04-21 LAB — COMPREHENSIVE METABOLIC PANEL
ALT: 19 U/L (ref 0–53)
AST: 17 U/L (ref 0–37)
Albumin: 4.1 g/dL (ref 3.5–5.2)
Alkaline Phosphatase: 76 U/L (ref 39–117)
BUN: 17 mg/dL (ref 6–23)
CO2: 31 mEq/L (ref 19–32)
Calcium: 8.9 mg/dL (ref 8.4–10.5)
Chloride: 102 mEq/L (ref 96–112)
Creatinine, Ser: 0.97 mg/dL (ref 0.40–1.50)
GFR: 83.23 mL/min (ref >60.00)
Glucose, Bld: 96 mg/dL (ref 70–99)
Potassium: 3.7 mEq/L (ref 3.5–5.1)
Sodium: 141 mEq/L (ref 135–145)
Total Bilirubin: 0.6 mg/dL (ref 0.2–1.2)
Total Protein: 6.6 g/dL (ref 6.0–8.3)

## 2022-04-28 ENCOUNTER — Encounter: Payer: Self-pay | Admitting: Family Medicine

## 2022-04-28 ENCOUNTER — Ambulatory Visit (INDEPENDENT_AMBULATORY_CARE_PROVIDER_SITE_OTHER): Payer: 59 | Admitting: Family Medicine

## 2022-04-28 VITALS — BP 138/82 | HR 60 | Temp 97.9°F | Ht 63.0 in | Wt 188.0 lb

## 2022-04-28 DIAGNOSIS — Z0000 Encounter for general adult medical examination without abnormal findings: Secondary | ICD-10-CM

## 2022-04-28 DIAGNOSIS — Z7189 Other specified counseling: Secondary | ICD-10-CM

## 2022-04-28 DIAGNOSIS — I10 Essential (primary) hypertension: Secondary | ICD-10-CM

## 2022-04-28 DIAGNOSIS — K219 Gastro-esophageal reflux disease without esophagitis: Secondary | ICD-10-CM

## 2022-04-28 DIAGNOSIS — E7800 Pure hypercholesterolemia, unspecified: Secondary | ICD-10-CM

## 2022-04-28 DIAGNOSIS — M25569 Pain in unspecified knee: Secondary | ICD-10-CM

## 2022-04-28 MED ORDER — HYDROCHLOROTHIAZIDE 12.5 MG PO CAPS: ORAL_CAPSULE | ORAL | 3 refills | Status: DC

## 2022-04-28 MED ORDER — OMEPRAZOLE 20 MG PO CPDR: 20.0000 mg | DELAYED_RELEASE_CAPSULE | ORAL | Status: AC

## 2022-04-28 MED ORDER — LISINOPRIL 20 MG PO TABS: 20.0000 mg | ORAL_TABLET | Freq: Every day | ORAL | 3 refills | Status: DC

## 2022-04-28 MED ORDER — PRAVASTATIN SODIUM 10 MG PO TABS: 10.0000 mg | ORAL_TABLET | Freq: Every day | ORAL | 3 refills | Status: DC

## 2022-04-28 NOTE — Progress Notes (Signed)
CPE- See plan.  Routine anticipatory guidance given to patient.  See health maintenance.  The possibility exists that previously documented standard health maintenance information may have been brought forward from a previous encounter into this note.  If needed, that same information has been updated to reflect the current situation based on today's encounter.    Tetanus 2016 Flu shot done prev.  PNA shot not due, d/w pt.  Shingrix done covid done prev.   Colonoscopy 2016 Prostate cancer screening and PSA options (with potential risks and benefits of testing vs not testing) were discussed along with recent recs/guidelines.  He declined testing PSA at this point.  Living will d/w pt.  Wife would be designated if patient were incapacitated.   Diet d/w pt.   Exercise- d/w pt.   HCV and HIV prev neg.   His father died last year.  Another friend recently died.  Condolences offered.    Screening done at outside clinic d/w pt.  No f/u needed on those results.   Meloxicam helped with knee pain.  Nsaid cautions d/w pt. Cr wnl, d/w pt.  He had prev injection per ortho.    GERD controlled with QOD PPI, failed taper.    Elevated Cholesterol: Using medications without problems:yes Muscle aches: no Diet compliance: yes Exercise: yes  Hypertension:    Using medication without problems or lightheadedness:  yes Chest pain with exertion:no Edema:not unless he had prolonged sitting on a bus trip.  D/w pt about OTC compression stocks.   Short of breath:no Still swimming 1.25 miles at a time.    PMH and SH reviewed  Meds, vitals, and allergies reviewed.   ROS: Per HPI.  Unless specifically indicated otherwise in HPI, the patient denies:  General: fever. Eyes: acute vision changes ENT: sore throat Cardiovascular: chest pain Respiratory: SOB GI: vomiting GU: dysuria Musculoskeletal: acute back pain Derm: acute rash Neuro: acute motor dysfunction Psych: worsening mood Endocrine:  polydipsia Heme: bleeding Allergy: hayfever  GEN: nad, alert and oriented HEENT: ncat NECK: supple w/o LA CV: rrr. PULM: ctab, no inc wob ABD: soft, +bs EXT: no edema SKIN: no acute rash

## 2022-04-28 NOTE — Patient Instructions (Signed)
Don't change your meds for now.  Update me as needed.  Thanks for your effort.  Take care.  Glad to see you.

## 2022-04-30 DIAGNOSIS — M25569 Pain in unspecified knee: Secondary | ICD-10-CM | POA: Insufficient documentation

## 2022-04-30 NOTE — Assessment & Plan Note (Signed)
  Meloxicam helped with knee pain.  Nsaid cautions d/w pt. Cr wnl, d/w pt.  He had prev injection per ortho.  Continue as needed meloxicam with routine NSAID cautions discussed.

## 2022-04-30 NOTE — Assessment & Plan Note (Signed)
GERD controlled with QOD PPI, failed taper.   Continue Prilosec as is.

## 2022-04-30 NOTE — Assessment & Plan Note (Signed)
Labs discussed with patient.  Continue lisinopril hydrochlorothiazide.

## 2022-04-30 NOTE — Assessment & Plan Note (Signed)
Tetanus 2016 Flu shot done prev.  PNA shot not due, d/w pt.  Shingrix done covid done prev.   Colonoscopy 2016 Prostate cancer screening and PSA options (with potential risks and benefits of testing vs not testing) were discussed along with recent recs/guidelines.  He declined testing PSA at this point.  Living will d/w pt.  Wife would be designated if patient were incapacitated.   Diet d/w pt.   Exercise- d/w pt.   HCV and HIV prev neg.

## 2022-04-30 NOTE — Assessment & Plan Note (Signed)
Continue pravastatin.  Continue work on diet and exercise. 

## 2022-10-26 ENCOUNTER — Other Ambulatory Visit: Payer: Self-pay | Admitting: Family Medicine

## 2022-10-26 ENCOUNTER — Encounter: Payer: Self-pay | Admitting: Family Medicine

## 2022-10-26 MED ORDER — CLOTRIMAZOLE-BETAMETHASONE 1-0.05 % EX CREA: 1.0000 | TOPICAL_CREAM | Freq: Every day | CUTANEOUS | 1 refills | Status: AC

## 2022-10-27 DIAGNOSIS — E785 Hyperlipidemia, unspecified: Secondary | ICD-10-CM | POA: Diagnosis not present

## 2022-10-27 DIAGNOSIS — Z6832 Body mass index (BMI) 32.0-32.9, adult: Secondary | ICD-10-CM | POA: Diagnosis not present

## 2022-10-27 DIAGNOSIS — Z809 Family history of malignant neoplasm, unspecified: Secondary | ICD-10-CM | POA: Diagnosis not present

## 2022-10-27 DIAGNOSIS — K219 Gastro-esophageal reflux disease without esophagitis: Secondary | ICD-10-CM | POA: Diagnosis not present

## 2022-10-27 DIAGNOSIS — E669 Obesity, unspecified: Secondary | ICD-10-CM | POA: Diagnosis not present

## 2022-10-27 DIAGNOSIS — Z8249 Family history of ischemic heart disease and other diseases of the circulatory system: Secondary | ICD-10-CM | POA: Diagnosis not present

## 2022-10-27 DIAGNOSIS — I10 Essential (primary) hypertension: Secondary | ICD-10-CM | POA: Diagnosis not present

## 2022-10-27 DIAGNOSIS — Z7982 Long term (current) use of aspirin: Secondary | ICD-10-CM | POA: Diagnosis not present

## 2022-10-27 DIAGNOSIS — Z791 Long term (current) use of non-steroidal anti-inflammatories (NSAID): Secondary | ICD-10-CM | POA: Diagnosis not present

## 2022-10-27 DIAGNOSIS — M1990 Unspecified osteoarthritis, unspecified site: Secondary | ICD-10-CM | POA: Diagnosis not present

## 2023-04-23 ENCOUNTER — Other Ambulatory Visit: Payer: Self-pay | Admitting: Family Medicine

## 2023-04-23 DIAGNOSIS — I10 Essential (primary) hypertension: Secondary | ICD-10-CM

## 2023-04-25 ENCOUNTER — Other Ambulatory Visit: Payer: Self-pay | Admitting: Family Medicine

## 2023-04-25 ENCOUNTER — Other Ambulatory Visit (INDEPENDENT_AMBULATORY_CARE_PROVIDER_SITE_OTHER): Payer: 59

## 2023-04-25 DIAGNOSIS — I10 Essential (primary) hypertension: Secondary | ICD-10-CM | POA: Diagnosis not present

## 2023-04-25 LAB — COMPREHENSIVE METABOLIC PANEL
ALT: 17 U/L (ref 0–53)
AST: 15 U/L (ref 0–37)
Albumin: 4.1 g/dL (ref 3.5–5.2)
Alkaline Phosphatase: 74 U/L (ref 39–117)
BUN: 16 mg/dL (ref 6–23)
CO2: 29 meq/L (ref 19–32)
Calcium: 8.8 mg/dL (ref 8.4–10.5)
Chloride: 106 meq/L (ref 96–112)
Creatinine, Ser: 0.97 mg/dL (ref 0.40–1.50)
GFR: 82.64 mL/min (ref >60.00)
Glucose, Bld: 91 mg/dL (ref 70–99)
Potassium: 3.7 meq/L (ref 3.5–5.1)
Sodium: 143 meq/L (ref 135–145)
Total Bilirubin: 0.5 mg/dL (ref 0.2–1.2)
Total Protein: 6.5 g/dL (ref 6.0–8.3)

## 2023-04-25 LAB — LIPID PANEL
Cholesterol: 170 mg/dL (ref 0–200)
HDL: 56.7 mg/dL (ref >39.00)
LDL Cholesterol: 86 mg/dL (ref 0–99)
NonHDL: 113.24
Total CHOL/HDL Ratio: 3
Triglycerides: 137 mg/dL (ref 0.0–149.0)
VLDL: 27.4 mg/dL (ref 0.0–40.0)

## 2023-05-02 ENCOUNTER — Encounter: Payer: Self-pay | Admitting: Family Medicine

## 2023-05-02 ENCOUNTER — Ambulatory Visit (INDEPENDENT_AMBULATORY_CARE_PROVIDER_SITE_OTHER): Payer: 59 | Admitting: Family Medicine

## 2023-05-02 VITALS — BP 134/80 | HR 56 | Temp 98.1°F | Ht 63.0 in | Wt 188.4 lb

## 2023-05-02 DIAGNOSIS — I10 Essential (primary) hypertension: Secondary | ICD-10-CM

## 2023-05-02 DIAGNOSIS — M1990 Unspecified osteoarthritis, unspecified site: Secondary | ICD-10-CM

## 2023-05-02 DIAGNOSIS — Z7189 Other specified counseling: Secondary | ICD-10-CM

## 2023-05-02 DIAGNOSIS — E7800 Pure hypercholesterolemia, unspecified: Secondary | ICD-10-CM

## 2023-05-02 DIAGNOSIS — Z0000 Encounter for general adult medical examination without abnormal findings: Secondary | ICD-10-CM

## 2023-05-02 DIAGNOSIS — G4700 Insomnia, unspecified: Secondary | ICD-10-CM

## 2023-05-02 DIAGNOSIS — R21 Rash and other nonspecific skin eruption: Secondary | ICD-10-CM

## 2023-05-02 MED ORDER — MELOXICAM 7.5 MG PO TABS: ORAL_TABLET | ORAL | 3 refills | Status: DC

## 2023-05-02 MED ORDER — CETIRIZINE HCL 10 MG PO TABS: 10.0000 mg | ORAL_TABLET | Freq: Every day | ORAL | Status: AC

## 2023-05-02 NOTE — Progress Notes (Unsigned)
CPE- See plan.  Routine anticipatory guidance given to patient.  See health maintenance.  The possibility exists that previously documented standard health maintenance information may have been brought forward from a previous encounter into this note.  If needed, that same information has been updated to reflect the current situation based on today's encounter.    Tetanus 2016 Flu shot done prev.  PNA shot not due, d/w pt.  Shingrix done covid done prev.   Colonoscopy 2016 Prostate cancer screening and PSA options (with potential risks and benefits of testing vs not testing) were discussed along with recent recs/guidelines.  He declined testing PSA at this point.  Living will d/w pt.  Wife would be designated if patient were incapacitated.   Diet d/w pt.   Exercise- d/w pt.   HCV and HIV prev neg.   D/w pt about swimming and weight training with exercise.    Hypertension:    Using medication without problems or lightheadedness: yes Chest pain with exertion:no Edema:no Short of breath:no Labs d/w pt.    Elevated Cholesterol: Using medications without problems: yes Muscle aches: no Diet compliance: yes Exercise: yes  Has used lotrisone prn.  No ADE on med.    D/w pt about meloxicam use.  He cut back to 7.5mg  and did well with that.  He had failed taper, with more left knee and hand pain.    Insomnia noted in the last year.  Had used zyrtec prn.  That helped. D/w pt about use.    PMH and SH reviewed  Meds, vitals, and allergies reviewed.   ROS: Per HPI.  Unless specifically indicated otherwise in HPI, the patient denies:  General: fever. Eyes: acute vision changes ENT: sore throat Cardiovascular: chest pain Respiratory: SOB GI: vomiting GU: dysuria Musculoskeletal: acute back pain Derm: acute rash Neuro: acute motor dysfunction Psych: worsening mood Endocrine: polydipsia Heme: bleeding Allergy: hayfever  GEN: nad, alert and oriented HEENT: mucous membranes  moist NECK: supple w/o LA CV: rrr. PULM: ctab, no inc wob ABD: soft, +bs EXT: no edema SKIN: no acute rash Lipomas on the trunk- not a new issue- not ttp.

## 2023-05-02 NOTE — Patient Instructions (Signed)
Don't change your meds for now.  Update me as needed.  Take care.  Glad to see you.   

## 2023-05-03 DIAGNOSIS — G4700 Insomnia, unspecified: Secondary | ICD-10-CM | POA: Insufficient documentation

## 2023-05-03 DIAGNOSIS — M1990 Unspecified osteoarthritis, unspecified site: Secondary | ICD-10-CM | POA: Insufficient documentation

## 2023-05-03 NOTE — Assessment & Plan Note (Signed)
Continue zytrec.

## 2023-05-03 NOTE — Assessment & Plan Note (Signed)
Continue diet and exercise, continue pravastatin.

## 2023-05-03 NOTE — Assessment & Plan Note (Signed)
Continue diet and exercise, continue hydrochlorothiazide and lisinopril.

## 2023-05-03 NOTE — Assessment & Plan Note (Signed)
D/w pt about meloxicam use.  He cut back to 7.5mg  and did well with that.  He had failed taper, with more left knee and hand pain off med.  Continue as is. Cr d/w pt.

## 2023-05-03 NOTE — Assessment & Plan Note (Signed)
  Living will d/w pt.  Wife would be designated if patient were incapacitated.   

## 2023-05-03 NOTE — Assessment & Plan Note (Signed)
Has used lotrisone prn.  No ADE on med. Continue prn use.

## 2023-05-03 NOTE — Assessment & Plan Note (Signed)
Tetanus 2016 Flu shot done prev.  PNA shot not due, d/w pt.  Shingrix done covid done prev.   Colonoscopy 2016 Prostate cancer screening and PSA options (with potential risks and benefits of testing vs not testing) were discussed along with recent recs/guidelines.  He declined testing PSA at this point.  Living will d/w pt.  Wife would be designated if patient were incapacitated.   Diet d/w pt.   Exercise- d/w pt.   HCV and HIV prev neg.  

## 2024-04-16 ENCOUNTER — Other Ambulatory Visit: Payer: Self-pay | Admitting: Family Medicine

## 2024-04-25 ENCOUNTER — Other Ambulatory Visit

## 2024-04-25 ENCOUNTER — Ambulatory Visit: Payer: Self-pay | Admitting: Family Medicine

## 2024-04-25 ENCOUNTER — Other Ambulatory Visit: Payer: Self-pay | Admitting: Family Medicine

## 2024-04-25 DIAGNOSIS — I10 Essential (primary) hypertension: Secondary | ICD-10-CM

## 2024-04-25 LAB — COMPREHENSIVE METABOLIC PANEL WITH GFR
ALT: 15 U/L (ref 3–53)
AST: 14 U/L (ref 5–37)
Albumin: 4 g/dL (ref 3.5–5.2)
Alkaline Phosphatase: 73 U/L (ref 39–117)
BUN: 18 mg/dL (ref 6–23)
CO2: 32 meq/L (ref 19–32)
Calcium: 8.7 mg/dL (ref 8.4–10.5)
Chloride: 105 meq/L (ref 96–112)
Creatinine, Ser: 1.06 mg/dL (ref 0.40–1.50)
GFR: 73.77 mL/min
Glucose, Bld: 93 mg/dL (ref 70–99)
Potassium: 3.8 meq/L (ref 3.5–5.1)
Sodium: 142 meq/L (ref 135–145)
Total Bilirubin: 0.5 mg/dL (ref 0.2–1.2)
Total Protein: 6.6 g/dL (ref 6.0–8.3)

## 2024-04-25 LAB — LIPID PANEL
Cholesterol: 178 mg/dL (ref 28–200)
HDL: 51.1 mg/dL
LDL Cholesterol: 88 mg/dL (ref 10–99)
NonHDL: 126.94
Total CHOL/HDL Ratio: 3
Triglycerides: 196 mg/dL — ABNORMAL HIGH (ref 10.0–149.0)
VLDL: 39.2 mg/dL (ref 0.0–40.0)

## 2024-05-02 ENCOUNTER — Encounter: Payer: Self-pay | Admitting: Family Medicine

## 2024-05-02 ENCOUNTER — Ambulatory Visit: Admitting: Family Medicine

## 2024-05-02 VITALS — BP 142/78 | HR 59 | Temp 98.6°F | Ht 62.5 in | Wt 186.2 lb

## 2024-05-02 DIAGNOSIS — M1990 Unspecified osteoarthritis, unspecified site: Secondary | ICD-10-CM

## 2024-05-02 DIAGNOSIS — M24549 Contracture, unspecified hand: Secondary | ICD-10-CM

## 2024-05-02 DIAGNOSIS — E7800 Pure hypercholesterolemia, unspecified: Secondary | ICD-10-CM | POA: Diagnosis not present

## 2024-05-02 DIAGNOSIS — Z7189 Other specified counseling: Secondary | ICD-10-CM

## 2024-05-02 DIAGNOSIS — G4700 Insomnia, unspecified: Secondary | ICD-10-CM

## 2024-05-02 DIAGNOSIS — Z0000 Encounter for general adult medical examination without abnormal findings: Secondary | ICD-10-CM | POA: Diagnosis not present

## 2024-05-02 DIAGNOSIS — Z23 Encounter for immunization: Secondary | ICD-10-CM | POA: Diagnosis not present

## 2024-05-02 DIAGNOSIS — I10 Essential (primary) hypertension: Secondary | ICD-10-CM | POA: Diagnosis not present

## 2024-05-02 DIAGNOSIS — M24542 Contracture, left hand: Secondary | ICD-10-CM | POA: Diagnosis not present

## 2024-05-02 MED ORDER — LISINOPRIL 20 MG PO TABS: 20.0000 mg | ORAL_TABLET | Freq: Every day | ORAL | 3 refills | Status: AC

## 2024-05-02 MED ORDER — HYDROCHLOROTHIAZIDE 12.5 MG PO CAPS: 12.5000 mg | ORAL_CAPSULE | Freq: Every day | ORAL | 3 refills | Status: AC

## 2024-05-02 MED ORDER — PRAVASTATIN SODIUM 10 MG PO TABS: 10.0000 mg | ORAL_TABLET | Freq: Every day | ORAL | 3 refills | Status: AC

## 2024-05-02 MED ORDER — MELOXICAM 7.5 MG PO TABS: ORAL_TABLET | ORAL | 3 refills | Status: AC

## 2024-05-02 NOTE — Patient Instructions (Addendum)
 Let me know if the GI clinic doesn't contact you this year.  Take care.  Glad to see you. Check with the pharmacy about a Tdap.   Refer to Dr. Camella.  Let me know if you can't get an appointment.  [phone].  Let me know if your BP is persistently above 140/90.

## 2024-05-02 NOTE — Progress Notes (Addendum)
 Tetanus 2016, d/w pt.   Flu shot done prev.  PNA 2026 Shingrix done covid done prev.   Colonoscopy 2016, d/w pt.   Prostate cancer screening and PSA options (with potential risks and benefits of testing vs not testing) were discussed along with recent recs/guidelines.  He declined testing PSA at this point.  Living will d/w pt.  Wife would be designated if patient were incapacitated.   Diet d/w pt.   Exercise- d/w pt.   HCV and HIV prev neg.   Sleep improved with taking zyrtec .  No ADE on med.    Hypertension:               Using medication without problems or lightheadedness: yes Chest pain with exertion:no Edema:no Short of breath:no Labs d/w pt.     Elevated Cholesterol: Using medications without problems: yes Muscle aches: no Diet compliance: yes Exercise: yes  Still swimming and working out mult time per week.  I thanked him for his effort.   L palmar contraction. See exam.     Has used lotrisone  prn.  No ADE on med.  No recent use.    D/w pt about meloxicam  use.  He cut back to 7.5mg  and did well with that.  He had failed taper, with more left knee and hand pain.  Taking glucosamine.    Meds, vitals, and allergies reviewed.   ROS: Per HPI unless specifically indicated in ROS section   GEN: nad, alert and oriented HEENT: mucous membranes moist NECK: supple w/o LA CV: rrr. PULM: ctab, no inc wob ABD: soft, +bs EXT: no edema SKIN: no acute rash L palmar contraction.   Lipomas abd wall.    EKG done at part of welcome to medicare visit.  See documented notes on EKG report.

## 2024-05-02 NOTE — Progress Notes (Incomplete)
 "  Chief Complaint  Patient presents with   Annual Exam     Subjective:   Donald [name] is a 64 y.o. male who presents for a Welcome to Medicare Exam.   Visit info / Clinical Intake: Medicare Wellness Visit Type:: Welcome to [employer] (IPPE) Persons participating in visit and providing information:: patient Medicare Wellness Visit Mode:: In-person (required for WTM)  Fall Screening Falls in the past year?: 0 Number of falls in past year: 0 Was there an injury with Fall?: 0 Fall Risk Category Calculator: 0 Patient Fall Risk Level: Low Fall Risk  Fall Risk Patient at Risk for Falls Due to: No Fall Risks Fall risk Follow up: Falls evaluation completed    Allergies (verified) Lipitor [atorvastatin ] and Other   Current Medications (verified) Outpatient Encounter Medications as of 05/02/2024  Medication Sig   aspirin 81 MG tablet Take 1 tablet by mouth every other day.   Biotin 89999 MCG TABS Take by mouth. (Patient taking differently: Take by mouth. 5000 mg)   cetirizine  (ZYRTEC ) 10 MG tablet Take 1 tablet (10 mg total) by mouth daily.   Cholecalciferol (D3-50 PO) Take by mouth.   clotrimazole -betamethasone  (LOTRISONE ) cream Apply 1 Application topically daily.   FIBER PO Take by mouth. Equate Fiber-Take 1 tablespoon daily   Flaxseed, Linseed, (FLAX SEED OIL) 1000 MG CAPS Take 1 capsule by mouth daily. Flaxseed 1300 mg daily   FLUZONE HIGH-DOSE 0.5 ML injection    glucosamine-chondroitin 500-400 MG tablet Take 1 tablet by mouth 3 (three) times daily.   hydrochlorothiazide  (MICROZIDE ) 12.5 MG capsule TAKE 1 CAPSULE BY MOUTH EVERY DAY   lisinopril  (ZESTRIL ) 20 MG tablet TAKE 1 TABLET BY MOUTH EVERY DAY   meloxicam  (MOBIC ) 7.5 MG tablet Take 7.5 mg by mouth daily with food.   Multiple Vitamin (MULTIVITAMIN PO) Take by mouth daily.   Multiple Vitamins-Minerals (EYE HEALTH AREDS 2 PO) Take by mouth daily.   omeprazole  (PRILOSEC) 20 MG capsule Take 1 capsule (20 mg total) by mouth  every other day.   Povidone (IVIZIA DRY EYES OP) Apply to eye.   pravastatin  (PRAVACHOL ) 10 MG tablet TAKE 1 TABLET BY MOUTH EVERY DAY   Turmeric 500 MG CAPS Take by mouth.   vitamin C (ASCORBIC ACID) 500 MG tablet Take 1,000 mg by mouth daily.   No facility-administered encounter medications on file as of 05/02/2024.    History: Past Medical History:  Diagnosis Date   Arthritis of knee    Bradycardia    Heart murmur    unremarkable echo 2006   Hyperlipidemia    Hypertension    IBS (irritable bowel syndrome)    controlled with fiber supplement- diarrhea predominant   Past Surgical History:  Procedure Laterality Date   APPENDECTOMY  64 years old   MEDIAL COLLATERAL LIGAMENT REPAIR, KNEE Left    SHOULDER SURGERY Left 04/11/2012   Family History  Problem Relation Age of Onset   Cancer Mother    Kidney disease Mother    Hypertension Mother        secondary   Hyperlipidemia Mother    Heart disease Father        MI X 2, 58/60, PTCA   Hypertension Father    Obesity Sister    Hypertension Brother    Lung disease Brother        past smoker   Hypertension Brother    Heart disease Maternal Grandmother        MI   Heart disease Maternal  Grandfather        MI   Cancer Paternal Grandfather        prostate   Colon cancer Neg Hx    Prostate cancer Neg Hx    Social History   Occupational History   Occupation: COMPUTER PROGRAMMER    Employer: [employer]  Tobacco Use   Smoking status: Never   Smokeless tobacco: Never  Substance and Sexual Activity   Alcohol use: No   Drug use: No   Sexual activity: Yes   Tobacco Counseling Counseling given: Not Answered  SDOH Screenings   Depression (PHQ2-9): Low Risk (05/02/2024)  Tobacco Use: Low Risk (05/02/2024)   See flowsheets for full screening details  Depression Screen PHQ 2 & 9 Depression Scale- Over the past 2 weeks, how often have you been bothered by any of the following problems? Little interest or  pleasure in doing things: 0 Feeling down, depressed, or hopeless (PHQ Adolescent also includes...irritable): 0 PHQ-2 Total Score: 0 Trouble falling or staying asleep, or sleeping too much: 1 Feeling tired or having little energy: 0 Poor appetite or overeating (PHQ Adolescent also includes...weight loss): 0 Feeling bad about yourself - or that you are a failure or have let yourself or your family down: 0 Trouble concentrating on things, such as reading the newspaper or watching television (PHQ Adolescent also includes...like school work): 0 Moving or speaking so slowly that other people could have noticed. Or the opposite - being so fidgety or restless that you have been moving around a lot more than usual: 0 Thoughts that you would be better off dead, or of hurting yourself in some way: 0 PHQ-9 Total Score: 1 If you checked off any problems, how difficult have these problems made it for you to do your work, take care of things at home, or get along with other people?: Not difficult at all      Goals Addressed   None          Objective:    Today's Vitals   05/02/24 0930 05/02/24 0935  BP: (!) 150/78 (!) 142/78  Pulse: (!) 59   Temp: 98.6 F (37 C)   TempSrc: Oral   SpO2: 97%   Weight: 186 lb 4 oz (84.5 kg)   Height: 5' 2.5 (1.588 m)    Body mass index is 33.52 kg/m.   Physical Exam{(optional), or other factors deemed appropriate based on the beneficiary's medical and social history and current clinical standards. - TEXT BETWEEN BRACKETS WILL DISAPPEAR WHEN YOU SIGN THE ENCOUNTER:1}    Hearing/Vision screen No results found. Immunizations and Health Maintenance Health Maintenance  Topic Date Due   Pneumococcal Vaccine: 50+ Years (1 of 1 - PCV) Never done   Colonoscopy  08/18/2024   DTaP/Tdap/Td (3 - Td or Tdap) 04/06/2025   Medicare Annual Wellness (AWV)  05/02/2025   Influenza Vaccine  Completed   Hepatitis C Screening  Completed   HIV Screening  Completed   Zoster  Vaccines- Shingrix  Completed   Hepatitis B Vaccines 19-59 Average Risk  Aged Out   Meningococcal B Vaccine  Aged Out   COVID-19 Vaccine  Discontinued    EKG: {ekg findings:315101}     Assessment/Plan:  This is a routine wellness examination for Donald [name].  Patient Care Team: Cleatus Arlyss RAMAN, MD as PCP - General (Family Medicine)  I have personally reviewed and noted the following in the patients chart:   Medical and social history Use of alcohol, tobacco or illicit  drugs  Current medications and supplements including opioid prescriptions. Functional ability and status Nutritional status Physical activity Advanced directives List of other physicians Hospitalizations, surgeries, and ER visits in previous 12 months Vitals Screenings to include cognitive, depression, and falls Referrals and appointments  No orders of the defined types were placed in this encounter.  In addition, I have reviewed and discussed with patient certain preventive protocols, quality metrics, and best practice recommendations. A written personalized care plan for preventive services as well as general preventive health recommendations were provided to patient.   Danaisha Celli Y Mi Balla, CMA   05/02/2024   No follow-ups on file.  "

## 2024-05-03 DIAGNOSIS — M24549 Contracture, unspecified hand: Secondary | ICD-10-CM | POA: Insufficient documentation

## 2024-05-03 DIAGNOSIS — Z0000 Encounter for general adult medical examination without abnormal findings: Secondary | ICD-10-CM | POA: Insufficient documentation

## 2024-05-03 NOTE — Assessment & Plan Note (Signed)
  Living will d/w pt.  Wife would be designated if patient were incapacitated.   

## 2024-05-03 NOTE — Assessment & Plan Note (Signed)
 Sleep improved with taking zyrtec .  No ADE on med.   Would continue as is.

## 2024-05-03 NOTE — Assessment & Plan Note (Signed)
"  Refer to ortho  "

## 2024-05-03 NOTE — Assessment & Plan Note (Signed)
 Continue work on diet and exercise.  He can let me know if BP is persistently above 140/90.  Continue lisinopril  and hydrochlorothiazide 

## 2024-05-03 NOTE — Assessment & Plan Note (Signed)
Continue work on diet and exercise.  Continue pravastatin. 

## 2024-05-03 NOTE — Assessment & Plan Note (Signed)
 D/w pt about meloxicam  use.  He cut back to 7.5mg  and did well with that.  He had failed taper, with more left knee and hand pain.  Taking glucosamine.   Continue as is.

## 2024-05-03 NOTE — Assessment & Plan Note (Signed)
 Tetanus 2016, d/w pt.   Flu shot done prev.  PNA 2026 Shingrix done covid done prev.   Colonoscopy 2016, d/w pt.   Prostate cancer screening and PSA options (with potential risks and benefits of testing vs not testing) were discussed along with recent recs/guidelines.  He declined testing PSA at this point.  Living will d/w pt.  Wife would be designated if patient were incapacitated.   Diet d/w pt.   Exercise- d/w pt.   HCV and HIV prev neg.
